# Patient Record
Sex: Female | Born: 1935 | Race: White | Hispanic: No | Marital: Married | Smoking: Never smoker
Health system: Southern US, Community
[De-identification: ages and names within clinical notes are randomized; demographics above are authoritative.]

## PROBLEM LIST (undated history)

## (undated) DIAGNOSIS — C50912 Malignant neoplasm of unspecified site of left female breast: Secondary | ICD-10-CM

## (undated) DIAGNOSIS — M8580 Other specified disorders of bone density and structure, unspecified site: Secondary | ICD-10-CM

## (undated) DIAGNOSIS — E785 Hyperlipidemia, unspecified: Secondary | ICD-10-CM

## (undated) DIAGNOSIS — IMO0002 Reserved for concepts with insufficient information to code with codable children: Secondary | ICD-10-CM

## (undated) DIAGNOSIS — H409 Unspecified glaucoma: Secondary | ICD-10-CM

## (undated) DIAGNOSIS — Z9889 Other specified postprocedural states: Secondary | ICD-10-CM

## (undated) DIAGNOSIS — C189 Malignant neoplasm of colon, unspecified: Secondary | ICD-10-CM

## (undated) DIAGNOSIS — K635 Polyp of colon: Secondary | ICD-10-CM

## (undated) DIAGNOSIS — I10 Essential (primary) hypertension: Secondary | ICD-10-CM

## (undated) DIAGNOSIS — C50911 Malignant neoplasm of unspecified site of right female breast: Secondary | ICD-10-CM

## (undated) DIAGNOSIS — M1990 Unspecified osteoarthritis, unspecified site: Secondary | ICD-10-CM

## (undated) DIAGNOSIS — R112 Nausea with vomiting, unspecified: Secondary | ICD-10-CM

## (undated) HISTORY — PX: TUBAL LIGATION: SHX77

## (undated) HISTORY — DX: Polyp of colon: K63.5

## (undated) HISTORY — PX: CATARACT EXTRACTION W/ INTRAOCULAR LENS  IMPLANT, BILATERAL: SHX1307

## (undated) HISTORY — DX: Other specified disorders of bone density and structure, unspecified site: M85.80

## (undated) HISTORY — DX: Unspecified osteoarthritis, unspecified site: M19.90

## (undated) HISTORY — DX: Hyperlipidemia, unspecified: E78.5

## (undated) HISTORY — DX: Unspecified glaucoma: H40.9

## (undated) HISTORY — DX: Reserved for concepts with insufficient information to code with codable children: IMO0002

## (undated) HISTORY — DX: Essential (primary) hypertension: I10

---

## 1984-04-16 HISTORY — PX: CHOLECYSTECTOMY OPEN: SUR202

## 1990-09-16 DIAGNOSIS — C50911 Malignant neoplasm of unspecified site of right female breast: Secondary | ICD-10-CM

## 1990-09-16 HISTORY — DX: Malignant neoplasm of unspecified site of right female breast: C50.911

## 1990-09-16 HISTORY — PX: MASTECTOMY: SHX3

## 1990-09-16 HISTORY — PX: BREAST BIOPSY: SHX20

## 1999-09-04 ENCOUNTER — Other Ambulatory Visit: Admission: RE | Admit: 1999-09-04 | Discharge: 1999-09-04 | Payer: Self-pay | Admitting: Obstetrics and Gynecology

## 1999-09-17 DIAGNOSIS — C189 Malignant neoplasm of colon, unspecified: Secondary | ICD-10-CM

## 1999-09-17 HISTORY — DX: Malignant neoplasm of colon, unspecified: C18.9

## 2000-01-24 ENCOUNTER — Encounter (INDEPENDENT_AMBULATORY_CARE_PROVIDER_SITE_OTHER): Payer: Self-pay | Admitting: *Deleted

## 2000-01-24 ENCOUNTER — Ambulatory Visit (HOSPITAL_COMMUNITY): Admission: RE | Admit: 2000-01-24 | Discharge: 2000-01-24 | Payer: Self-pay | Admitting: Surgery

## 2000-02-12 ENCOUNTER — Encounter: Payer: Self-pay | Admitting: Surgery

## 2000-02-20 ENCOUNTER — Encounter (INDEPENDENT_AMBULATORY_CARE_PROVIDER_SITE_OTHER): Payer: Self-pay | Admitting: Specialist

## 2000-02-20 ENCOUNTER — Inpatient Hospital Stay (HOSPITAL_COMMUNITY): Admission: RE | Admit: 2000-02-20 | Discharge: 2000-02-23 | Payer: Self-pay | Admitting: Surgery

## 2000-02-20 HISTORY — PX: COLECTOMY: SHX59

## 2000-04-18 ENCOUNTER — Encounter: Payer: Self-pay | Admitting: Surgery

## 2000-04-18 ENCOUNTER — Emergency Department (HOSPITAL_COMMUNITY): Admission: EM | Admit: 2000-04-18 | Discharge: 2000-04-18 | Payer: Self-pay | Admitting: Emergency Medicine

## 2000-09-10 ENCOUNTER — Other Ambulatory Visit: Admission: RE | Admit: 2000-09-10 | Discharge: 2000-09-10 | Payer: Self-pay | Admitting: Family Medicine

## 2001-03-06 ENCOUNTER — Ambulatory Visit (HOSPITAL_COMMUNITY): Admission: RE | Admit: 2001-03-06 | Discharge: 2001-03-06 | Payer: Self-pay | Admitting: Surgery

## 2001-07-03 ENCOUNTER — Encounter: Payer: Self-pay | Admitting: Ophthalmology

## 2001-07-07 ENCOUNTER — Ambulatory Visit (HOSPITAL_COMMUNITY): Admission: RE | Admit: 2001-07-07 | Discharge: 2001-07-07 | Payer: Self-pay | Admitting: Ophthalmology

## 2001-09-11 ENCOUNTER — Other Ambulatory Visit: Admission: RE | Admit: 2001-09-11 | Discharge: 2001-09-11 | Payer: Self-pay | Admitting: *Deleted

## 2001-12-30 ENCOUNTER — Ambulatory Visit: Admission: RE | Admit: 2001-12-30 | Discharge: 2001-12-30 | Payer: Self-pay | Admitting: Gynecology

## 2002-03-15 ENCOUNTER — Encounter: Payer: Self-pay | Admitting: Family Medicine

## 2002-03-15 ENCOUNTER — Encounter: Admission: RE | Admit: 2002-03-15 | Discharge: 2002-03-15 | Payer: Self-pay | Admitting: Family Medicine

## 2002-04-13 ENCOUNTER — Ambulatory Visit (HOSPITAL_COMMUNITY): Admission: RE | Admit: 2002-04-13 | Discharge: 2002-04-13 | Payer: Self-pay | Admitting: Ophthalmology

## 2002-09-20 ENCOUNTER — Other Ambulatory Visit: Admission: RE | Admit: 2002-09-20 | Discharge: 2002-09-20 | Payer: Self-pay | Admitting: *Deleted

## 2003-08-09 ENCOUNTER — Ambulatory Visit (HOSPITAL_COMMUNITY): Admission: RE | Admit: 2003-08-09 | Discharge: 2003-08-09 | Payer: Self-pay | Admitting: Obstetrics and Gynecology

## 2003-08-10 ENCOUNTER — Ambulatory Visit (HOSPITAL_COMMUNITY): Admission: RE | Admit: 2003-08-10 | Discharge: 2003-08-10 | Payer: Self-pay | Admitting: Surgery

## 2003-12-02 ENCOUNTER — Other Ambulatory Visit: Admission: RE | Admit: 2003-12-02 | Discharge: 2003-12-02 | Payer: Self-pay | Admitting: *Deleted

## 2005-12-30 ENCOUNTER — Other Ambulatory Visit: Admission: RE | Admit: 2005-12-30 | Discharge: 2005-12-30 | Payer: Self-pay | Admitting: Obstetrics and Gynecology

## 2007-01-16 ENCOUNTER — Other Ambulatory Visit: Admission: RE | Admit: 2007-01-16 | Discharge: 2007-01-16 | Payer: Self-pay | Admitting: Obstetrics & Gynecology

## 2007-03-18 ENCOUNTER — Encounter: Admission: RE | Admit: 2007-03-18 | Discharge: 2007-03-18 | Payer: Self-pay | Admitting: Surgery

## 2008-01-21 ENCOUNTER — Other Ambulatory Visit: Admission: RE | Admit: 2008-01-21 | Discharge: 2008-01-21 | Payer: Self-pay | Admitting: Obstetrics & Gynecology

## 2008-08-09 ENCOUNTER — Encounter: Admission: RE | Admit: 2008-08-09 | Discharge: 2008-08-09 | Payer: Self-pay | Admitting: Radiology

## 2010-08-02 ENCOUNTER — Encounter: Admission: RE | Admit: 2010-08-02 | Discharge: 2010-08-02 | Payer: Self-pay | Admitting: Family Medicine

## 2010-11-28 ENCOUNTER — Ambulatory Visit
Admission: RE | Admit: 2010-11-28 | Discharge: 2010-11-28 | Disposition: A | Discharge status: Home / Self Care | Payer: Medicare Other | Source: Ambulatory Visit | Attending: Family Medicine | Admitting: Family Medicine

## 2010-11-28 ENCOUNTER — Other Ambulatory Visit: Payer: Self-pay | Admitting: Family Medicine

## 2010-11-28 DIAGNOSIS — M79671 Pain in right foot: Secondary | ICD-10-CM

## 2010-11-28 DIAGNOSIS — M25571 Pain in right ankle and joints of right foot: Secondary | ICD-10-CM

## 2011-02-01 NOTE — H&P (Signed)
NAME:  Morgan Mullins, Morgan Mullins                          ACCOUNT NO.:  [id number]   MEDICAL RECORD NO.:  [id number]                   PATIENT TYPE:  OIB   LOCATION:  2861                                 FACILITY:  [facility]   PHYSICIAN:  Guadelupe Sabin, M.D.             DATE OF BIRTH:  27-Jun-1936   DATE OF ADMISSION:  04/13/2002  DATE OF DISCHARGE:                                HISTORY & PHYSICAL   HISTORY OF PRESENT ILLNESS:  This was a planned outpatient readmission of  this 75 year old white female, admitted for cataract implant surgery of the  right eye.  This patient has been noted to have bilateral cataract  formation.  She was previously admitted on 07/07/2001 for successful  cataract implant surgery of the left eye.  She now returns for similar  surgery of the right eye.   PAST MEDICAL HISTORY:  Stable general health.  No serious illnesses.  Patient under the care of Dr. Gaylan Gerold.   REVIEW OF SYSTEMS:  No cardiorespiratory complaint.   PHYSICAL EXAMINATION:  VITAL SIGNS:  Blood pressure 131/82, respirations 16,  heart rate 53, temperature 97.  GENERAL APPEARANCE:  The patient is a pleasant well developed, well  nourished 75 year old white female, in no acute distress.  HEENT:  Eyes -- Visual acuity with best correction 20/40 right eye, 20/25  left eye.  Applanation tonometry normal; 18 mm right eye, 19 mm left eye.  Slit lamp examination -- The eyes are white and clear.  The right eye  reveals an anterior subcapsular cataract and nuclear cataract.  The left eye  shows a clear well-centered posterior chamber intraocular lens implant, with  a clear capsule.  Detailed dilated fundus examination with indirect  ophthalmoscopy reveals a clear vitreous; behind the cataract there is haze  in the right eye.  The vitreous is clear in both eyes.  The retina attached  with normal optic nerve, blood vessels and macula.   IMPRESSION:  Nuclear and anterior subcapsular cataract, right eye.  Pseudophakia left eye.   SURGICAL PLAN:  Cataract implant surgery, right eye.                                               Guadelupe Sabin, M.D.    HNJ/MEDQ  D:  04/13/2002  T:  04/14/2002  Job:  [id number]   cc:   [place]

## 2011-02-01 NOTE — Discharge Summary (Signed)
[place]. [facility]  Patient:    Morgan Mullins, Morgan Mullins                       MRN: [id number] Adm. Date:  [id number] Disc. Date: [id number] Attending:  Andre Lefort CC:         Dyanne Carrel, M.D.                           Discharge Summary  DATE OF BIRTH:  [birth year].  DISCHARGE DIAGNOSES: 1. 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 2. Status post right breast cancer in 1992. 3. Status post parotid surgery in 1995. 4. Tubal ligation. 5. History of mild reflux esophagitis.  OPERATIONS PERFORMED:  The patient had a right hemicolectomy, laparoscopically assisted on February 20, 2000.  HISTORY OF PRESENT ILLNESS:  Morgan Mullins is a 75 year old white female who is a patient of Dyanne Carrel, M.D.  She has a colonoscopy based on both her history of breast cancer and a family history of colon cancer but had no other suspicious findings.  The colonoscopy done by me reveals a broad-based villous adenoma that was not amenable to resection by colonoscope and felt she would be best served with a laparoscopic attempted right hemicolectomy.  She is disease-free from breast cancer diagnosed in 78. She also had parotid surgery in 1995 which she has been well with.  On the day of admission after completing a mechanical and antibiotic bowel prep, she was taken to the operating room where she underwent a right hemicolectomy which was laparoscopically assisted and a stapled side-to-side anastomosis.  Postoperatively she did extremely well.  She was walking the hall the first day after surgery.  She had some bowel gas the second day after surgery and was started on clear liquids and then advanced to a soft diet.  Her final pathology was [id number], read by Dr. Jimmy Picket, revealed a right colon mildly differentiated colonic adenocarcinoma invading into the  submucosa arising a large sessile tubovillous adenoma.  She had a T1, N0 carcinoma.  DISCHARGE INSTRUCTIONS:  She will be on a regular diet.  No lifting for a period of about four weeks.  She is to see me back in seven days. She can take Vicodin for pain or Tylenol.  She can shower. She is to call for any problems.  DISCHARGE CONDITION:  Good. DD:  04/16/00 TD:  04/17/00 Job: [id number]

## 2011-02-01 NOTE — Op Note (Signed)
[place]. [facility]  Patient:    Morgan Mullins, Morgan Mullins                       MRN: [id number] Proc. Date: 03/06/01 Adm. Date:  [id number] Attending:  Andre Lefort CC:         Dyanne Carrel, M.D.   Operative Report  PREOPERATIVE DIAGNOSIS:  Right colon cancer resected June 2001.  POSTOPERATIVE DIAGNOSIS:  Normal anastomosis with sigmoid diverticulosis.  PROCEDURE:  Flexible colonoscopy.  SURGEON:  Sandria Bales. Ezzard Standing, M.D.  ANESTHESIA:  7 mg of Versed, 75 mcg of fentanyl.  INDICATION FOR PROCEDURE:  [name] is a 75 year old white female who had a right hemicolectomy for a right colon cancer resected in June 2001.  She has done well from her colon cancer with no colonic complaint, a normal CEA.  She now comes back for her one-year follow-up colonoscopy.  DESCRIPTION OF PROCEDURE:  She was unable to complete her GoLYTELY bowel prep but drank enough that she thought she had clear stools, and I thought it was worth going ahead.  She was placed on telemetry, blood pressure cuff, pulse oximetry, had nasal O2 and an IV in her left hand.  She was given fentanyl because she had some nausea which she thought was associated with Demerol.  A flexible colonoscope was passed.  She had a tortuous sigmoid colon, which made this somewhat difficult advancing.  I was able to advance the scope, however, around to approximately 140 cm, identifying a transverse colon to ileal anastomosis at about 140 cm.  The transverse colon, left colon were unremarkable.  The sigmoid colon, again, was somewhat tortuous, thickened, with diverticular disease, which made it difficult advancing the scope through this area.  I suggest next time probably using a pediatric colonoscope on her and, in fact, I stripped one of the deflectors on the colonoscope after advancing the scope entirely, which did limit to some extent visualizing the bowel coming out, but again I saw no polyp,  lesion, or mass.  She tolerated the procedure well.  Would recommend repeat colonoscopy in three years.  Spoke with her husband.  Will see her back for regular follow-up in six months. DD:  03/06/01 TD:  03/06/01 Job: [id number]

## 2011-02-01 NOTE — Op Note (Signed)
[place]. [facility]  Patient:    Morgan Mullins, Morgan Mullins                       MRN: [id number] Proc. Date: 02/20/00 Adm. Date:  [id number] Attending:  Andre Lefort CC:         Dyanne Carrel, M.D.             Lorne Skeens. Hoxworth, M.D.                           Operative Report  DATE OF BIRTH:  09-22-1935.  PREOPERATIVE DIAGNOSIS:  Tubulovillous adenoma of the right colon.  POSTOPERATIVE DIAGNOSIS:  Tubulovillous adenoma of the right colon, approximately 3 cm in diameter.  PROCEDURE:  Laparoscopically-assisted right hemicolectomy with stapled side-to-side anastomosis.  SURGEON:  Sandria Bales. Ezzard Standing, M.D.  FIRST ASSISTANT:  Sharlet Salina T. Hoxworth, M.D.  ANESTHESIA:  General endotracheal.  ESTIMATED BLOOD LOSS:  100 cc.  DRAINS LEFT IN:  None.  INDICATION FOR PROCEDURE:  Ms. Lahm is a 75 year old white female who had a prior right breast cancer in 1992.  She has had a history of colonic polyps in the past and had a follow-up colonoscopy approximately one month ago.  The colonoscopy revealed a broad-based polyp of the right colon, which on biopsy proved to be tubulovillous in nature.  She now comes for attempted laparoscopic-assisted right hemicolectomy.  The indications and complications of the procedure were explained to the patient.  OPERATIVE NOTE:  The patient was placed in the supine position.  She was given a general endotracheal anesthetic.  She had an NG tube in place, was given 1 g of Cefotetan at initiation of the procedure and had a Foley catheter in place. PAS stockings were then placed, and her arms were tucked by her side.  Her abdomen was then prepped with Betadine solution and sterilely draped.  An infraumbilical incision was made with a 0 degree laparoscope inserted through a 12 mm Hasson trocar, and this was inserted and secured with a 0 Vicryl suture.   A laparoscopic examination was then carried out.  The patient  was noted to have adhesions in the right upper quadrant from her prior cholecystectomy incision.  She had adhesions of stomach to the anterior abdominal wall, and the hepatic flexure was stuck up in the gallbladder fossa, but her right and left colons were unremarkable near the lesion.  The remainder of her bowel was unremarkable for any other lesion.  She had no obvious pelvic mass or lesion.  Through this, the trocars were placed, a 10 mm trocar in the suprapubic location, a 10 mm trocar in the left lower quadrant location, and a 5 mm trocar in the subcostal location.  The camera was then moved to the left lower quadrant location, giving [place] three arms to work with.  Interestingly, the cecum actually was more right upper quadrant based and fairly floppy.  Using the Harmonic scalpel, I was able to free up the stomach from the anterior abdominal wall.  I was able to free the colon down from the gallbladder bed under direct visualization, which was seen to be pretty minimal blood loss.  I then went along the line of Toldt lateral to the right colon.  The colon mobilized well enough.  I then opened the lesser sac and pulled down the transverse colon.  At this point, I thought I had good  mobility of the colon.  I did not find a ureter, though I thought I was dissecting well anterior to where the ureter was.  I actually did get anterior to the right kidney, which I identified.  I identified the duodenum, which was well posterior to our dissection.  At this time, I took out the umbilical trocar, made an open incision in the abdomen, which was about 6 or 7 cm in size, and delivered the right colon, terminal ileum, and transverse colon into the abdominal wound.  I thought I had good length of mesentery up to take an apron of the tissue.  Actually, she had even one palpable lymph node that I could feel up in the specimens I got below.  The specimen did have some neovascularization in it.  There  was a mass about 3 cm, which I could feel, and I thought we identified the right segment of bowel.  At this time we took the mesentery down using 2-0 and 3-0 silk ties and suture ligatures.  After freeing up the distal 5-6 cm of the terminal ileum and the transverse colon, I then carried out a stapled side-to-side anastomosis using the Endo-GIA stapler 70 mm length. I then inspected the suture lines, in which there was no evidence of bleeding and appeared to be intact, and the sutures fired well, and then I fired across with a TA55 stapler, divided the bowel, and delivered the specimen off the table.  I inspected the bowel.  I closed the mesentery with interrupted 2-0 silk sutures.  I laid some omentum over the staple line to cover this and then dropped the bowel back into the abdominal cavity.  I then opened the specimen on the table side and identified the specimen and thought I had good margins both proximally and distally.  I then closed the abdomen with two running #1 PDS sutures, which I tied in the middle.  I then re-insufflated the abdomen.  There was no leak of air or no weakness in the midline and closure.  There was no evidence of any bleeding intra-abdominally or where the gallbladder bed had been taken down.  I removed the other trocars under direct visualization and closed each wound with skin staples.  The patient tolerated the procedure well.  The wound was then sterilely dressed.  I removed her NG tube at the end of the case, will leave the Foley in overnight.  The sponge and needle count were correct at the end of the case. DD:  02/20/00 TD:  02/23/00 Job: [id number]

## 2011-02-01 NOTE — Op Note (Signed)
[place]. [facility]  Patient:    Morgan Mullins, Morgan Mullins Visit Number: [id number] MRN: [id number]          Service Type: DSU Location: [facility] 2899 23 Attending Physician:  Ivor Messier Dictated by:   Guadelupe Sabin, M.D. Proc. Date: 07/07/01 Admit Date:  07/07/2001 Discharge Date: 07/07/2001   CC:         Dyanne Carrel, M.D.   Operative Report  PREOPERATIVE DIAGNOSIS: Nuclear and anterior subcapsular cataract, left eye.  POSTOPERATIVE DIAGNOSIS: Nuclear and anterior subcapsular cataract, left eye.  OPERATION/PROCEDURE: Planned extracapsular cataract extraction, phacoemulsification, primary insertion of posterior chamber intraocular lens implant.  SURGEON: Guadelupe Sabin, M.D.  ASSISTANT: Nurse.  ANESTHESIA: Local 4% xylocaine, 0.75% Marcaine, anesthesia standby required. Patient given sodium pentothal intravenously during the period of retrobulbar injection.  DESCRIPTION OF PROCEDURE: AFter the patient was prepped and draped a lid speculum was inserted in the left eye.  Schiotz tenometry was recorded at 7 scale units with a 5.5 g weight.  A peritomy was performed adjacent to the limbus from the 11 oclock to one oclock position.  The corneoscleral junction was cleaned and a corneoscleral groove made with the 45 degree Superblade.  The anterior chamber was then entered with the 2.5 mm diamond keratome at the 12 oclock position and the 15 degree blade at the 2:30 oclock position.  Using a bent 26 gauge needle on a Healon syringe a circular capsulorrhexis was begun and then completed with the Grabow forceps. Hydrodissection and hydrodelineation were performed using 1% xylocaine.  The 30 degree phacoemulsification tip was then inserted, with slow emulsification of the rather soft nucleus.  The nucleus was aspirate and the residual cortex aspirated with the irrigation-aspiration tip.  There appeared to be a small film-like opacity  behind the posterior capsule.  This was felt to represent either a tiny break the posterior capsule with some migration of the Healon into the anterior vitreous.  It did not appear to be a cortex fragment or a nuclear fragment.  It was then elected to insert an Allergan Medical Optics SI40MB silicone three-piece posterior chamber intraocular lens implant in the capsular bag.  This was inserted with the McDonalds forceps into the anterior chamber and then centered into the capsular bag.  At no time was there any vitreous noted in the anterior chamber or any pupillary disfiguration indicating vitreous loss.  The lens appeared to be well centered.  A small fragment of cortex which remained at the 12 oclock position was partially aspirated.  Since there was no vitreous incarceration it was elected to close. The operative incisions appeared to be self-sealing and no sutures were required.  Maxitrol was instilled in the conjunctival cul-de-sac and a light patch and protective shield were applied to the operated eye.  Duration of procedure and anesthesia administration, 45 minutes.  The patient tolerated the procedure well in general. Dictated by:   Guadelupe Sabin, M.D. Attending Physician:  Ivor Messier DD:  07/07/01 TD:  07/08/01 Job: [id number]

## 2011-02-01 NOTE — Op Note (Signed)
. [facility]  Patient:    Morgan Mullins, Morgan Mullins                      MRN: [id number] Proc. Date: 01/24/00 Adm. Date:  [id number] Attending:  Andre Lefort CC:         Dyanne Carrel, M.D.                           Operative Report  DATE OF BIRTH:  March 14, 1936  CCS#:  [id number]  PREOPERATIVE DIAGNOSIS:  History of colonic polyps with strong family history of colon cancer.  POSTOPERATIVE DIAGNOSIS:  Cecal neoplasm, pathology pending.  Diverticulosis of the sigmoid colon.  PROCEDURE:  Colonoscopy with biopsy of cecal mass.  SURGEON:  Sandria Bales. Ezzard Standing, M.D.  ANESTHESIA:  Demerol 60 mg, 6 mg of Versed.  INDICATIONS:  [name] is a 75 year old white female, a patient of Dr. Blair Heys, who in the past, has had a colonoscopy by Dr. Enzo Bi. Blievernicht.  She had a history of polyps of the sigmoid and ascending colon removed before.  She also has a strong history of colon cancer.  She comes for colonoscopy evaluation.  She does give a history of having some change in her bowel habits and some difficulty initiating her bowel habits.  PROCEDURE NOTE:  The patient had a GoLYTELY bowel prep the evening before the colonoscopy.  She was in a left lateral decubitus position, an IV started in her left hand.  She had a pulse oximeter.  EKG tracings and blood pressure cuff monitoring.  A flexible colonoscope was passed.  She had a fairly tortuous sigmoid colon and this made it difficult advancing the colon.  However, I was able to advance the scope with some difficulty around to the cecum.  I identified the ileocecal valve.  Opposite the ileocecal valve, the patient had a 3-4 cm polyp or mass, which was sessile, feathery looking and looked like a villous adenoma.  I biopsied this x 6.  I do not think the polyp is amenable to colonoscopic resection.  The scope was then slowly withdrawn.  The lining of the mucosa was otherwise normal.  In  the remainder of the right colon, the transverse colon, left colon and the sigmoid colon there was probably moderate diverticulosis with some thickening of the bowel wall, but there was no obvious mucosal lesion.  The scope was retroflexed within the rectum.  There was no mass or lesion.  IMPRESSION: 1. [name] has a neoplasm of her right colon with biopsies pending that was    not amenable to colonic resection.  Will probably need to have a colon    resection. 2. Scattered sigmoid diverticulosis.  I discussed the findings with her husband and she will see me back in the office in about five days to review pathology and review findings. DD:  01/24/00 TD:  01/25/00 Job: [id number]

## 2011-02-01 NOTE — Op Note (Signed)
Morgan Mullins, Morgan Mullins                          ACCOUNT NO.:  [id number]   MEDICAL RECORD NO.:  [id number]                   PATIENT TYPE:  OIB   LOCATION:  2861                                 FACILITY:  [facility]   PHYSICIAN:  Guadelupe Sabin, M.D.             DATE OF BIRTH:  [birth year]   DATE OF PROCEDURE:  04/13/2002  DATE OF DISCHARGE:                                 OPERATIVE REPORT   PREOPERATIVE DIAGNOSES:  Anterior subcapsular and nuclear cataract, right  eye.   POSTOPERATIVE DIAGNOSES:  Anterior subcapsular and nuclear cataract, right  eye.   OPERATION:  1. Planned extracapsular cataract extraction/phacoemulsification.  2. Primary insertion of posterior chamber intraocular lens implant, right     eye.   SURGEON:  Guadelupe Sabin, M.D.   ASSISTANT:  Nurse.   ANESTHESIA:  Local, 4% Xylocaine and 0.75 Marcaine retrobulbar block;  topical tetracaine, intraocular Xylocaine.   OPERATIVE PROCEDURE:  After the patient was prepped and draped; a lid  speculum was inserted in the right eye.  Schiotz tonometry was recorded at 5  scale units, with a 5.5 g weight.  A peritomy was performed adjacent to the  limbus from the 11 to 1 o'clock position.  The corneoscleral junction was  cleaned and a corneoscleral groove made with a 45-degree superblade at the  12 o'clock position.  The anterior chamber was then entered with a 2.5 mm  diamond keratome at the 12 o'clock position, and the 15-degree blade at the  2:30 position.  Using a bent 26-gauge needle on a Healon syringe, a circular  capsulorhexis was begun and then completed with the Graybill forceps.  Hydrodissection and hydrodelineation were performed using 1% Xylocaine.  A  30-degree phacoemulsification tip was then inserted, with slow-controlled  emulsification of the lens nucleus.  Total ultrasonic time:  55 sec.  Average Power Level:  18%.  Total amount of fluid used during the  emulsification:  80 cc.   Following removal  of the nucleus, the residual cortex was aspirated with the  irrigation aspiration tip.  The posterior capsule appeared intact, with a  brilliant red fundus reflux.  It was therefore elected to insert an Allergan  Medical Optics SI40 and B-silicone three-piece posterior chamber intraocular  implant with UV absorber.  Diopter strength:  +20.50.  This was inserted  with a McDonald forceps into the anterior chamber, and then centered into  the capsular bag using the lesser lens rotator.  The lens appeared to be  well centered.  The Healon which had been used throughout the procedure was  then aspirated and replaced with balanced salt solution and Miochol  ophthalmic solution.  The operative incisions appeared to be self-sealing  and no sutures were required.  Maxitrol ointment was instilled in the  conjunctival cul-de-sac and a light patch and protective shield applied to  the operated right eye.   DURATION OF PROCEDURE AND ANESTHESIA  ADMINISTRATION:  45 minutes.    DISPOSITION:  The patient tolerated the procedure well in general.  Left the  operating room for the recovery room in good condition.                                               Guadelupe Sabin, M.D.    HNJ/MEDQ  D:  04/13/2002  T:  04/14/2002  Job:  [id number]

## 2011-02-01 NOTE — H&P (Signed)
[place]. [facility]  Patient:    BHAVANA, KADY Visit Number: [id number] MRN: [id number]          Service Type: DSU Location: [facility] 2899 23 Attending Physician:  Ivor Messier Dictated by:   Guadelupe Sabin, M.D. Admit Date:  07/07/2001 Discharge Date: 07/07/2001   CC:         Dyanne Carrel, M.D.   History and Physical  CHIEF COMPLAINT: This was a planned outpatient admission of this 75 year old white female, admitted for cataract implant surgery of the left eye.  HISTORY OF PRESENT ILLNESS: This patient has been followed in my office since May 02, 1969.  Initial examination at that time revealed a visual acuity without correction of 20/20.  The patient has done well over the ensuing years but has recently gradually developed cataract formation in both eyes, greater in the left than right eye.  Vision had deteriorated to 20/40- by March 05, 2001 and the patient was having trouble with blurred vision, and this was annoying her.  She elected to proceed with cataract implant surgery.  She was given oral discussion and printed information concerning the procedure and its possible complications.  She signed an informed consent and arrangements were made for her outpatient admission at this time.  PAST MEDICAL HISTORY: The patient is in stable general health, under the care of Dr. Manus Gunning.  The patient has a past history of colon carcinoma with resection in June 2001.  Colonoscopy was performed on March 06, 2001.  REVIEW OF SYSTEMS: No cardiorespiratory complaints.  PHYSICAL EXAMINATION:  GENERAL: The patient is a pleasant, well-nourished, well-developed white female in no acute distress.  HEENT: Eyes, visual acuity as noted above.  Applanation tenometry 15 mm right eye, 18 mm left eye.  External ocular and slit-lamp examination, the eyes are white and clear with cataract formation in both eyes, nuclear type, and anterior  subcapsular cataract change in the left eye.  Fundus examination normal.  CHEST: Lungs clear to percussion and auscultation.  HEART: Normal sinus rhythm.  No cardiomegaly.  No murmurs.  ABDOMEN: Negative.  EXTREMITIES: Negative.  ADMISSION DIAGNOSIS: Nuclear and anterior subcapsular cataract, left eye.  SURGICAL PLAN: Cataract implant surgery, left eye. Dictated by:   Guadelupe Sabin, M.D. Attending Physician:  Ivor Messier DD:  07/07/01 TD:  07/08/01 Job: [id number]

## 2011-02-01 NOTE — Consult Note (Signed)
[facility]  Patient:    Morgan Mullins, Morgan Mullins Visit Number: [id number] MRN: [id number]          Service Type: GON Location: GYN Attending Physician:  Jeannette Corpus Dictated by:   Rande Brunt. Clarke-Pearson, M.D. Admit Date:  12/30/2001   CC:         Cheryle Horsfall, M.D.  Telford Nab, R.N.  Sandria Bales. Ezzard Standing, M.D.   Consultation Report  HISTORY OF PRESENT ILLNESS:  This is a 75 year old white female who is referred in consultation from Dr. Cheryle Horsfall for risk assessment of ovarian cancer. The patient has a personal history of breast cancer in 1992 and a recent history of early-stage colon cancer in 2001.  FAMILY HISTORY:  Remarkable in that her mother died at age 1 of advanced ovarian cancer, and her father died at age 15 of colon cancer. There are no other family members with ovarian cancer. A paternal aunt has developed breast cancer in her 82s. There are no other family members with breast cancer or colon cancer.  PAST MEDICAL HISTORY:  Breast cancer, colon cancer, hypercholesterolemia.  CURRENT MEDICATIONS: 1. Zocor. 2. Multivitamins. 3. Vitamin E. 4. Baby aspirin.  SOCIAL HISTORY:  The patient does not smoke or drink. The patient is married. She is a retired Catering manager.  ALLERGIES:  No known drug allergies.  GYNECOLOGICAL HISTORY:  Gravida 0.  REVIEW OF SYSTEMS:  Essentially negative.  PHYSICAL EXAMINATION:  VITAL SIGNS:  Weight 150 pounds, blood pressure 148/88, pulse 76, respiratory rate 18.  GENERAL:  The patient is a healthy, white female in no acute distress.  HEENT:  Negative.  NECK:  Supple without thyromegaly. There is no supraclavicular or inguinal adenopathy.  ABDOMEN:  Soft, nontender.  No masses, organomegaly, ascites or hernias noted. All of her scars are well healed.  PELVIC:  EG/BUS normal. Vagina is normal. No lesions are noted. The cervix is normal. The uterus is anterior, normal shape, size,  and consistency. There are no adnexal masses. Anus is normal. Stool is guaiac negative.  IMPRESSION:  This is a 75 year old white female with a personal history of breast cancer and colon cancer and a family history of a mother with ovarian cancer and a father with colon cancer and a paternal aunt with breast cancer. It is very difficult to assess the patients risk of having ovarian cancer. There is no doubt that it is increased given that she has 1 1st degree relative with ovarian cancer, and I believe her past history of breast cancer and colon cancer also increase her risk. On the other hand, reviewing the family history, this does not appear to be family that has the classic BRCA 1 or 2 mutations, although they may have a Lynch II pedigree on the paternal side. The patient and I had a lengthy discussion indicating that it was very difficult to assess her risk above and beyond saying that it is slightly increased. Certainly if she wished, bilateral salpingo-oophorectomy would be reasonable. On the other hand, if she does not wish to undergo surgery, surveillance using annual vaginal probe ultrasounds to evaluate the ovaries would also be reasonable. All questions were answered. At the conclusion of the discussion, she indicated she would prefer not to have surgery and would opt to undergo vaginal probe ultrasound. We did discuss CA125 value monitoring, and I would not recommend that given the high false-negative and false-positive rates. Dictated by:   Rande Brunt. Clarke-Pearson, M.D. Attending Physician:  Jeannette Corpus DD:  12/30/01 TD:  12/31/01 Job: [id number]

## 2011-02-01 NOTE — Op Note (Signed)
NAME:  Morgan Mullins, Morgan Mullins                          ACCOUNT NO.:  [id number]   MEDICAL RECORD NO.:  [id number]                   PATIENT TYPE:  AMB   LOCATION:  ENDO                                 FACILITY:  [facility]   PHYSICIAN:  Sandria Bales. Ezzard Standing, M.D.               DATE OF BIRTH:  10-May-1936   DATE OF PROCEDURE:  08/09/2003  DATE OF DISCHARGE:                                 OPERATIVE REPORT   PREOPERATIVE DIAGNOSIS:  History of right colon carcinoma.   POSTOPERATIVE DIAGNOSIS:  Stricture/narrowing of sigmoid colon at 30 cm,  without mucosa lesion.   PROCEDURE:  Sigmoidoscopy with scope advanced only to 70 cm.   SURGEON:  Sandria Bales. Ezzard Standing, M.D.   ANESTHESIA:  Demerol 75 mg, Versed 5 mg.   INDICATIONS FOR PROCEDURE:  The patient is a 75 year old white female who  had a right hemicolectomy in June 2001, for a T1, N0 carcinoma.  She has  done well postoperatively without evidence of recurrence; however, she has  complained of some left lower quadrant abdominal pain and now comes up for a  follow-up colonoscopy.  She completed a bowel prep at home.   DESCRIPTION OF PROCEDURE:  The patient was placed in the left lateral  decubitus position.  She had a pulse oximeter, an electrocardiogram, a blood  pressure cuff on, and was given nasal O2 and monitored during the procedure.  She was given initially 5 mg of Versed and 50 mg of Demerol.  She was given  an additional 25 mg of Demerol during the procedure.  I was able to advance  the scope easily to 30 cm, but at 30 cm I really had trouble forcing the  scope forward.  I did not see a mucosal lesion, but I felt like there was a  tight grip on the scope.  Whether this is actually scar tissue or this is a  narrowing from diverticular disease; I did see evidence of diverticulosis of  the sigmoid colon, is unclear.  I was able to push the scope up to 70 cm,  but I was using such force to advance the scope, I was really afraid of  doing some kind of  damage to the bowel.  Therefore I terminated the procedure at 70 cm, so this really counts only as  a sigmoidoscopy.  I did not get to the splenic flexure.  I will go ahead and  try to do a barium enema on her today, to evaluate the remainder of her  colon and this area of narrowing.   RECOMMENDATIONS:  I did speak to her husband about the findings.  I think  depending upon her symptoms, if this all proves to be benign, would probably  continue to follow her.  If these symptoms get worse, she actually may need  some type of resection to this portion of her colon; though again these  findings all appear  benign.  Again, pending upon her barium enema, would  recommend either an e x-barium enema or a colonoscopy, and she actually may  even be a candidate for a virtual colonoscopy in the future, because of this  area of narrowing.                                               Sandria Bales. Ezzard Standing, M.D.    DHN/MEDQ  D:  08/09/2003  T:  08/09/2003  Job:  [id number]

## 2011-02-14 ENCOUNTER — Encounter (INDEPENDENT_AMBULATORY_CARE_PROVIDER_SITE_OTHER): Payer: Self-pay | Admitting: Surgery

## 2011-04-17 ENCOUNTER — Ambulatory Visit (INDEPENDENT_AMBULATORY_CARE_PROVIDER_SITE_OTHER): Payer: Self-pay | Admitting: Surgery

## 2011-05-16 ENCOUNTER — Encounter (INDEPENDENT_AMBULATORY_CARE_PROVIDER_SITE_OTHER): Payer: Self-pay | Admitting: Surgery

## 2011-05-17 ENCOUNTER — Encounter (INDEPENDENT_AMBULATORY_CARE_PROVIDER_SITE_OTHER): Payer: Self-pay | Admitting: Surgery

## 2011-05-17 ENCOUNTER — Ambulatory Visit (INDEPENDENT_AMBULATORY_CARE_PROVIDER_SITE_OTHER): Payer: Medicare Other | Admitting: Surgery

## 2011-05-17 VITALS — BP 168/96 | HR 60 | Wt 134.0 lb

## 2011-05-17 DIAGNOSIS — Z85038 Personal history of other malignant neoplasm of large intestine: Secondary | ICD-10-CM | POA: Insufficient documentation

## 2011-05-17 DIAGNOSIS — Z853 Personal history of malignant neoplasm of breast: Secondary | ICD-10-CM | POA: Insufficient documentation

## 2011-05-17 NOTE — Progress Notes (Addendum)
ASSESSMENT AND PLAN: 1.  Stage 1 carcinoma of right breast  Treated with mastectomy in 1992.  Disease free.  2.  Stage 1 carcinoma of right colon  Treated with right colectomy 2001.  Disease free.  Plan one year follow-up.  Plan BE in one year (this will be 5 years from virtual colonoscopy.  She does not another virtual colonoscopy.)  3.  Diverticulitis.  Sigmoid colon.  Attack last November, 2011.  Has done well since then.   [facility] 1/2 negative - March 2013.  DN  11/28/2011] [Patient is CDH1 VUS - associated with breast cancer, colon cancer, and gastric cancer.  Dr. Para Skeans in [facility].  I talked to the patient on the phone.  They have suggested that she have the colonoscopies at [facility], which is fine by me.  DN 05/13/2012]  Morgan Mullins is a 75 y.o. (DOB: [birth year])  white female who is a patient of EHINGER,ROBERT R, MD and comes to me today for followup of colon and breast cancer.  The patient is doing well today. However, last November 2011, she had an attack of diverticulitis. She underwent a CT scan on 02 August 2010 this showed changes consistent with rectosigmoid colon diverticulitis. She responded to antibiotics and has had only one mild attack since then. She has noticed no change in her bowel habits and has had no blood in her stool.  She has had no trouble with her left breast.  She had a normal mammogram 07/31/2010.  We talked a little about Dr. Primitivo Gauze, who did her right mastectomy.  Current Outpatient Prescriptions on File Prior to Visit  Medication Sig Dispense Refill  . aspirin 81 MG tablet Take 81 mg by mouth daily.        Marland Kitchen CALCIUM-VITAMIN D PO Take by mouth daily.        . Cholecalciferol (VITAMIN D PO) Take 1,000 Units by mouth daily.        Marland Kitchen lisinopril (PRINIVIL,ZESTRIL) 10 MG tablet Take 10 mg by mouth daily.        . Multiple Vitamins-Minerals (CENTRUM SILVER PO) Take 1 tablet by mouth daily.        . simvastatin (ZOCOR) 40 MG tablet Take 40 mg  by mouth at bedtime.        . Travoprost (TRAVATAN OP) Apply to eye.         PHYSICAL EXAM: BP 168/96  Pulse 60  Wt 134 lb (60.782 kg)  HEENT: Normal. Pupils equal. Normal dentition. Neck: Supple. No thyroid mass. Lymph Nodes:  No supraclavicular or cervical nodes. Lungs: Clear and symmetric. Heart:  RRR. No murmur. Breasts:  Right breast absent.  No mass.  Left breast unremarkable.  She does have a 1.8 cm seborrheic keratosis at 12 o'clock on the skin.  Abdomen: No mass. No tenderness. No hernia. Normal bowel sounds.  No abdominal scars. Rectal: No mass.  Guiac negative. Extremities:  Good strength in upper and lower extremities. Neurologic:  Grossly intact to motor and sensory function.  DATA REVIEWED: CT scan - 08/02/2010 - showed sigmoid diverticulitis and two small low attenuation liver lesions. Mammogram - Nov. 2011 - negative.

## 2011-09-06 ENCOUNTER — Ambulatory Visit: Payer: Medicare Other

## 2011-09-06 NOTE — Progress Notes (Signed)
Pt. Seen for genetic counseling.  She will get more family history to determine which genetic syndrome to consider for blood draw.

## 2011-10-09 DIAGNOSIS — H4011X0 Primary open-angle glaucoma, stage unspecified: Secondary | ICD-10-CM | POA: Diagnosis not present

## 2011-10-22 DIAGNOSIS — Z79899 Other long term (current) drug therapy: Secondary | ICD-10-CM | POA: Diagnosis not present

## 2011-10-22 DIAGNOSIS — D179 Benign lipomatous neoplasm, unspecified: Secondary | ICD-10-CM | POA: Diagnosis not present

## 2011-10-22 DIAGNOSIS — E7800 Pure hypercholesterolemia, unspecified: Secondary | ICD-10-CM | POA: Diagnosis not present

## 2011-10-22 DIAGNOSIS — C189 Malignant neoplasm of colon, unspecified: Secondary | ICD-10-CM | POA: Diagnosis not present

## 2011-10-22 DIAGNOSIS — I10 Essential (primary) hypertension: Secondary | ICD-10-CM | POA: Diagnosis not present

## 2011-10-28 ENCOUNTER — Ambulatory Visit: Payer: Medicare Other | Admitting: Lab

## 2011-10-28 ENCOUNTER — Ambulatory Visit: Payer: Medicare Other

## 2011-10-28 DIAGNOSIS — Z803 Family history of malignant neoplasm of breast: Secondary | ICD-10-CM | POA: Diagnosis not present

## 2011-10-28 DIAGNOSIS — Z85038 Personal history of other malignant neoplasm of large intestine: Secondary | ICD-10-CM | POA: Diagnosis not present

## 2011-10-28 DIAGNOSIS — Z8041 Family history of malignant neoplasm of ovary: Secondary | ICD-10-CM | POA: Diagnosis not present

## 2011-10-28 DIAGNOSIS — Z853 Personal history of malignant neoplasm of breast: Secondary | ICD-10-CM | POA: Diagnosis not present

## 2011-10-28 NOTE — Progress Notes (Signed)
Pt seen previously for genetic counseling.  Blood drawn today for BRCA 1/2 and CancerNext.

## 2011-11-17 DIAGNOSIS — H401112 Primary open-angle glaucoma, right eye, moderate stage: Secondary | ICD-10-CM | POA: Insufficient documentation

## 2011-11-17 DIAGNOSIS — H4050X0 Glaucoma secondary to other eye disorders, unspecified eye, stage unspecified: Secondary | ICD-10-CM | POA: Insufficient documentation

## 2011-11-18 DIAGNOSIS — H4050X0 Glaucoma secondary to other eye disorders, unspecified eye, stage unspecified: Secondary | ICD-10-CM | POA: Diagnosis not present

## 2011-12-23 DIAGNOSIS — R05 Cough: Secondary | ICD-10-CM | POA: Diagnosis not present

## 2011-12-23 DIAGNOSIS — I10 Essential (primary) hypertension: Secondary | ICD-10-CM | POA: Diagnosis not present

## 2012-01-30 ENCOUNTER — Other Ambulatory Visit: Payer: Self-pay | Admitting: Dermatology

## 2012-01-30 DIAGNOSIS — L570 Actinic keratosis: Secondary | ICD-10-CM | POA: Diagnosis not present

## 2012-01-30 DIAGNOSIS — C44519 Basal cell carcinoma of skin of other part of trunk: Secondary | ICD-10-CM | POA: Diagnosis not present

## 2012-01-30 DIAGNOSIS — L820 Inflamed seborrheic keratosis: Secondary | ICD-10-CM | POA: Diagnosis not present

## 2012-01-30 DIAGNOSIS — L821 Other seborrheic keratosis: Secondary | ICD-10-CM | POA: Diagnosis not present

## 2012-01-30 DIAGNOSIS — D239 Other benign neoplasm of skin, unspecified: Secondary | ICD-10-CM | POA: Diagnosis not present

## 2012-03-25 DIAGNOSIS — C44519 Basal cell carcinoma of skin of other part of trunk: Secondary | ICD-10-CM | POA: Diagnosis not present

## 2012-04-30 DIAGNOSIS — I10 Essential (primary) hypertension: Secondary | ICD-10-CM | POA: Diagnosis not present

## 2012-04-30 DIAGNOSIS — I951 Orthostatic hypotension: Secondary | ICD-10-CM | POA: Diagnosis not present

## 2012-04-30 DIAGNOSIS — R42 Dizziness and giddiness: Secondary | ICD-10-CM | POA: Diagnosis not present

## 2012-04-30 DIAGNOSIS — Z853 Personal history of malignant neoplasm of breast: Secondary | ICD-10-CM | POA: Diagnosis not present

## 2012-04-30 DIAGNOSIS — Z85038 Personal history of other malignant neoplasm of large intestine: Secondary | ICD-10-CM | POA: Diagnosis not present

## 2012-04-30 DIAGNOSIS — Z79899 Other long term (current) drug therapy: Secondary | ICD-10-CM | POA: Diagnosis not present

## 2012-04-30 DIAGNOSIS — E7800 Pure hypercholesterolemia, unspecified: Secondary | ICD-10-CM | POA: Diagnosis not present

## 2012-05-06 DIAGNOSIS — C189 Malignant neoplasm of colon, unspecified: Secondary | ICD-10-CM | POA: Diagnosis not present

## 2012-05-06 DIAGNOSIS — I10 Essential (primary) hypertension: Secondary | ICD-10-CM | POA: Diagnosis not present

## 2012-05-06 DIAGNOSIS — K5730 Diverticulosis of large intestine without perforation or abscess without bleeding: Secondary | ICD-10-CM | POA: Diagnosis not present

## 2012-05-06 DIAGNOSIS — C50919 Malignant neoplasm of unspecified site of unspecified female breast: Secondary | ICD-10-CM | POA: Diagnosis not present

## 2012-05-06 DIAGNOSIS — D010 Carcinoma in situ of colon: Secondary | ICD-10-CM | POA: Diagnosis not present

## 2012-05-06 DIAGNOSIS — Z79899 Other long term (current) drug therapy: Secondary | ICD-10-CM | POA: Diagnosis not present

## 2012-05-26 ENCOUNTER — Encounter (INDEPENDENT_AMBULATORY_CARE_PROVIDER_SITE_OTHER): Payer: Self-pay | Admitting: Surgery

## 2012-05-26 ENCOUNTER — Ambulatory Visit (INDEPENDENT_AMBULATORY_CARE_PROVIDER_SITE_OTHER): Payer: Medicare Other | Admitting: Surgery

## 2012-05-26 VITALS — BP 142/84 | HR 64 | Temp 98.3°F | Resp 18 | Ht 65.0 in | Wt 137.4 lb

## 2012-05-26 DIAGNOSIS — Z853 Personal history of malignant neoplasm of breast: Secondary | ICD-10-CM | POA: Diagnosis not present

## 2012-05-26 DIAGNOSIS — Z85038 Personal history of other malignant neoplasm of large intestine: Secondary | ICD-10-CM

## 2012-05-26 NOTE — Progress Notes (Signed)
ASSESSMENT AND PLAN: 1.  Stage 1 carcinoma of right breast  Treated with mastectomy in 1992 by Dr. Kathie Rhodes. [facility].  Disease free.  2.  Stage 1 carcinoma of right colon  Treated with right colectomy 2001.  Disease free.  Plan one year follow-up.  To get colonoscopy at [facility].  3.  Diverticulitis.  Sigmoid colon.  Attack last November, 2011.  Has done well since then.   4.  BRCA 1/2 negative - March 2013.    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  She saw Dr. Para Skeans in [facility].  They have suggested that she have the colonoscopies at [facility], which is fine by me.  She has yet to schedule this.  Morgan Mullins is a 76 y.o. (DOB: [birth year])  white female who is a patient of EHINGER,ROBERT R, MD and comes to me today for followup of colon and breast cancer.  She is doing very well.  She is on a high fiber diet.  She has had no further diverticulitis.  See A&P regarding her genetics.  Current Outpatient Prescriptions on File Prior to Visit  Medication Sig Dispense Refill  . aspirin 81 MG tablet Take 81 mg by mouth daily.        Marland Kitchen CALCIUM-VITAMIN D PO Take by mouth daily.        . Cholecalciferol (VITAMIN D PO) Take 1,000 Units by mouth daily.        . dorzolamide-timolol (COSOPT) 22.3-6.8 MG/ML ophthalmic solution BID times 48H.      Marland Kitchen lisinopril (PRINIVIL,ZESTRIL) 10 MG tablet Take 10 mg by mouth daily.        . Multiple Vitamins-Minerals (CENTRUM SILVER PO) Take 1 tablet by mouth daily.        . simvastatin (ZOCOR) 40 MG tablet Take 40 mg by mouth at bedtime.       . Travoprost (TRAVATAN OP) Apply to eye.         PHYSICAL EXAM: BP 142/84  Pulse 64  Temp 98.3 F (36.8 C)  Resp 18  Ht 5\' 5"  (1.651 m)  Wt 137 lb 6.4 oz (62.324 kg)  BMI 22.86 kg/m2  HEENT: Normal. Pupils equal. Normal dentition. Neck: Supple. No thyroid mass. Lymph Nodes:  No supraclavicular, cervical nodes, or axillary node..  No evidence of lymphedema. Lungs:  Clear and symmetric. Heart:  RRR. No murmur. Breasts:  Right breast absent.  No mass.    Left breast unremarkable.  She does have a 1.8 cm seborrheic keratosis at 12 o'clock on the skin.  Abdomen: No mass. No tenderness. No hernia. Normal bowel sounds.  Scars look okay. Rectal: I did not do a rectal, because she is planning a colonoscopy at [facility]  DATA REVIEWED: Mammogram, [facility] - 2 Nov. 2012 - negative.

## 2012-06-11 DIAGNOSIS — Z23 Encounter for immunization: Secondary | ICD-10-CM | POA: Diagnosis not present

## 2012-06-29 DIAGNOSIS — Z9189 Other specified personal risk factors, not elsewhere classified: Secondary | ICD-10-CM | POA: Diagnosis not present

## 2012-06-29 DIAGNOSIS — Z01419 Encounter for gynecological examination (general) (routine) without abnormal findings: Secondary | ICD-10-CM | POA: Diagnosis not present

## 2012-06-29 DIAGNOSIS — Z124 Encounter for screening for malignant neoplasm of cervix: Secondary | ICD-10-CM | POA: Diagnosis not present

## 2012-07-29 DIAGNOSIS — Z09 Encounter for follow-up examination after completed treatment for conditions other than malignant neoplasm: Secondary | ICD-10-CM | POA: Diagnosis not present

## 2012-07-29 DIAGNOSIS — Z85038 Personal history of other malignant neoplasm of large intestine: Secondary | ICD-10-CM | POA: Diagnosis not present

## 2012-07-29 DIAGNOSIS — Z8500 Personal history of malignant neoplasm of unspecified digestive organ: Secondary | ICD-10-CM | POA: Diagnosis not present

## 2012-07-29 DIAGNOSIS — K319 Disease of stomach and duodenum, unspecified: Secondary | ICD-10-CM | POA: Diagnosis not present

## 2012-07-29 DIAGNOSIS — K648 Other hemorrhoids: Secondary | ICD-10-CM | POA: Diagnosis not present

## 2012-08-05 DIAGNOSIS — Z1231 Encounter for screening mammogram for malignant neoplasm of breast: Secondary | ICD-10-CM | POA: Diagnosis not present

## 2012-08-17 DIAGNOSIS — H4050X0 Glaucoma secondary to other eye disorders, unspecified eye, stage unspecified: Secondary | ICD-10-CM | POA: Diagnosis not present

## 2012-11-10 DIAGNOSIS — Z79899 Other long term (current) drug therapy: Secondary | ICD-10-CM | POA: Diagnosis not present

## 2012-11-10 DIAGNOSIS — E7800 Pure hypercholesterolemia, unspecified: Secondary | ICD-10-CM | POA: Diagnosis not present

## 2012-11-10 DIAGNOSIS — Z1331 Encounter for screening for depression: Secondary | ICD-10-CM | POA: Diagnosis not present

## 2012-11-10 DIAGNOSIS — Z853 Personal history of malignant neoplasm of breast: Secondary | ICD-10-CM | POA: Diagnosis not present

## 2012-11-10 DIAGNOSIS — I10 Essential (primary) hypertension: Secondary | ICD-10-CM | POA: Diagnosis not present

## 2012-11-10 DIAGNOSIS — Z85038 Personal history of other malignant neoplasm of large intestine: Secondary | ICD-10-CM | POA: Diagnosis not present

## 2013-01-29 DIAGNOSIS — H4050X0 Glaucoma secondary to other eye disorders, unspecified eye, stage unspecified: Secondary | ICD-10-CM | POA: Diagnosis not present

## 2013-02-03 ENCOUNTER — Other Ambulatory Visit: Payer: Self-pay | Admitting: Dermatology

## 2013-02-03 DIAGNOSIS — D485 Neoplasm of uncertain behavior of skin: Secondary | ICD-10-CM | POA: Diagnosis not present

## 2013-02-03 DIAGNOSIS — L570 Actinic keratosis: Secondary | ICD-10-CM | POA: Diagnosis not present

## 2013-02-03 DIAGNOSIS — D1801 Hemangioma of skin and subcutaneous tissue: Secondary | ICD-10-CM | POA: Diagnosis not present

## 2013-02-03 DIAGNOSIS — Z85828 Personal history of other malignant neoplasm of skin: Secondary | ICD-10-CM | POA: Diagnosis not present

## 2013-02-03 DIAGNOSIS — L821 Other seborrheic keratosis: Secondary | ICD-10-CM | POA: Diagnosis not present

## 2013-02-03 DIAGNOSIS — L259 Unspecified contact dermatitis, unspecified cause: Secondary | ICD-10-CM | POA: Diagnosis not present

## 2013-03-10 DIAGNOSIS — H4050X0 Glaucoma secondary to other eye disorders, unspecified eye, stage unspecified: Secondary | ICD-10-CM | POA: Diagnosis not present

## 2013-05-10 DIAGNOSIS — Z79899 Other long term (current) drug therapy: Secondary | ICD-10-CM | POA: Diagnosis not present

## 2013-05-10 DIAGNOSIS — I10 Essential (primary) hypertension: Secondary | ICD-10-CM | POA: Diagnosis not present

## 2013-05-10 DIAGNOSIS — E7800 Pure hypercholesterolemia, unspecified: Secondary | ICD-10-CM | POA: Diagnosis not present

## 2013-05-21 ENCOUNTER — Other Ambulatory Visit: Payer: Self-pay

## 2013-05-21 NOTE — Telephone Encounter (Signed)
Patients last AEX was 06/29/12-refilled x 1 year. Next AEX scheduled 09/02/13. Chart on your desk to confirm. Please advise if this is ok

## 2013-05-23 MED ORDER — CITALOPRAM HYDROBROMIDE 20 MG PO TABS: 20.0000 mg | ORAL_TABLET | Freq: Every day | ORAL | Status: DC

## 2013-06-18 ENCOUNTER — Ambulatory Visit (INDEPENDENT_AMBULATORY_CARE_PROVIDER_SITE_OTHER): Payer: Medicare Other | Admitting: Surgery

## 2013-06-24 ENCOUNTER — Encounter (INDEPENDENT_AMBULATORY_CARE_PROVIDER_SITE_OTHER): Payer: Self-pay

## 2013-06-24 ENCOUNTER — Ambulatory Visit (INDEPENDENT_AMBULATORY_CARE_PROVIDER_SITE_OTHER): Payer: Medicare Other | Admitting: Surgery

## 2013-06-24 ENCOUNTER — Encounter (INDEPENDENT_AMBULATORY_CARE_PROVIDER_SITE_OTHER): Payer: Self-pay | Admitting: Surgery

## 2013-06-24 VITALS — BP 152/86 | HR 60 | Temp 98.2°F | Resp 16 | Ht 66.0 in | Wt 133.2 lb

## 2013-06-24 DIAGNOSIS — Z85038 Personal history of other malignant neoplasm of large intestine: Secondary | ICD-10-CM | POA: Diagnosis not present

## 2013-06-24 DIAGNOSIS — Z853 Personal history of malignant neoplasm of breast: Secondary | ICD-10-CM

## 2013-06-24 NOTE — Progress Notes (Addendum)
Name:  Janise Gora MRN:  [id number]  ASSESSMENT AND PLAN: 1.  Stage 1 carcinoma of right breast  Treated with mastectomy in 1992 by Dr. Kathie Rhodes. [facility].  Disease free.  2.  Stage 1 carcinoma of right colon  Treated with right colectomy 2001.  Stricture at about 30 cm when I did her colonoscopy in 07/2003.  Disease free.  Colonoscopy last year in [facility].  No definitd time for repeat colonoscopy.  Plan one year follow-up.  [A letter from Dr. Cheron Schaumann to the patient (she made me a copy) said that they usually stop doing colonoscopies at age 53.  The other option would be follow up colonoscopy in 5 years, when she is 67.  In his summary, he did not think that she is high risk for further GI cancer.  DN  07/05/2013] [She dropped me a note with my picture as part of the Bariatric Team from the [facility].  DN 6/187/2015]  3.  Diverticulitis.  Sigmoid colon.  Attack last November, 2011.  Has done well since then.   4.  BRCA 1/2 negative - March 2013.    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  She saw Dr. Para Skeans in [facility].    Ashleyann Shoun is a 77 y.o. (DOB: 1936/01/09)  white female who is a patient of EHINGER,ROBERT R, MD and comes to me today for followup of colon and breast cancer.  She is doing very well.  She is on a high fiber diet.  She has had no further diverticulitis.  See A&P regarding her genetics.  Her sister lives in [place] and is coming to the beach in [place] in one week.  Current Outpatient Prescriptions on File Prior to Visit  Medication Sig Dispense Refill  . aspirin 81 MG tablet Take 81 mg by mouth daily.        Marland Kitchen CALCIUM-VITAMIN D PO Take by mouth daily.        . Cholecalciferol (VITAMIN D PO) Take 1,000 Units by mouth daily.        . citalopram (CELEXA) 20 MG tablet Take 1 tablet (20 mg total) by mouth daily.  30 tablet  3  . dorzolamide-timolol (COSOPT) 22.3-6.8 MG/ML ophthalmic solution BID times 48H.       Marland Kitchen lisinopril (PRINIVIL,ZESTRIL) 10 MG tablet Take 10 mg by mouth daily.        . Multiple Vitamins-Minerals (CENTRUM SILVER PO) Take 1 tablet by mouth daily.        . simvastatin (ZOCOR) 40 MG tablet Take 40 mg by mouth at bedtime.       . Travoprost (TRAVATAN OP) Apply to eye.         No current facility-administered medications on file prior to visit.   Social History: No children Sister lives in [place].  PHYSICAL EXAM: BP 152/86  Pulse 60  Temp(Src) 98.2 F (36.8 C) (Temporal)  Resp 16  Ht 5\' 6"  (1.676 m)  Wt 133 lb 3.2 oz (60.419 kg)  BMI 21.51 kg/m2  HEENT: Normal. Pupils equal. Normal dentition. Neck: Supple. No thyroid mass. Lymph Nodes:  No supraclavicular, cervical nodes, or axillary node..  No evidence of lymphedema. Lungs: Clear and symmetric. Heart:  RRR. No murmur. Breasts:  Right breast absent.  No mass.    Left breast unremarkable.  She does have a 1.8 cm seborrheic keratosis at 12 o'clock on the skin.  Abdomen: No mass. No tenderness. No hernia. Normal  bowel sounds.  Scars look okay. Rectal: I did not do a rectal, because of recent colonoscopy at [facility]  DATA REVIEWED: Mammogram, [facility] - 2 Nov. 2012 - negative.

## 2013-06-30 DIAGNOSIS — Z23 Encounter for immunization: Secondary | ICD-10-CM | POA: Diagnosis not present

## 2013-07-28 DIAGNOSIS — H4011X0 Primary open-angle glaucoma, stage unspecified: Secondary | ICD-10-CM | POA: Diagnosis not present

## 2013-07-28 DIAGNOSIS — Z961 Presence of intraocular lens: Secondary | ICD-10-CM | POA: Diagnosis not present

## 2013-07-28 DIAGNOSIS — H43819 Vitreous degeneration, unspecified eye: Secondary | ICD-10-CM | POA: Diagnosis not present

## 2013-08-13 ENCOUNTER — Emergency Department (HOSPITAL_COMMUNITY)
Admission: EM | Admit: 2013-08-13 | Discharge: 2013-08-13 | Disposition: A | Discharge status: Home / Self Care | Payer: Medicare Other | Attending: Emergency Medicine | Admitting: Emergency Medicine

## 2013-08-13 ENCOUNTER — Encounter (HOSPITAL_COMMUNITY): Payer: Self-pay | Admitting: Emergency Medicine

## 2013-08-13 ENCOUNTER — Emergency Department (HOSPITAL_COMMUNITY): Payer: Medicare Other

## 2013-08-13 DIAGNOSIS — Z7982 Long term (current) use of aspirin: Secondary | ICD-10-CM | POA: Diagnosis not present

## 2013-08-13 DIAGNOSIS — Z79899 Other long term (current) drug therapy: Secondary | ICD-10-CM | POA: Insufficient documentation

## 2013-08-13 DIAGNOSIS — Z85038 Personal history of other malignant neoplasm of large intestine: Secondary | ICD-10-CM | POA: Diagnosis not present

## 2013-08-13 DIAGNOSIS — Z862 Personal history of diseases of the blood and blood-forming organs and certain disorders involving the immune mechanism: Secondary | ICD-10-CM | POA: Insufficient documentation

## 2013-08-13 DIAGNOSIS — R404 Transient alteration of awareness: Secondary | ICD-10-CM | POA: Diagnosis not present

## 2013-08-13 DIAGNOSIS — R918 Other nonspecific abnormal finding of lung field: Secondary | ICD-10-CM | POA: Diagnosis not present

## 2013-08-13 DIAGNOSIS — Z8639 Personal history of other endocrine, nutritional and metabolic disease: Secondary | ICD-10-CM | POA: Insufficient documentation

## 2013-08-13 DIAGNOSIS — R55 Syncope and collapse: Secondary | ICD-10-CM | POA: Insufficient documentation

## 2013-08-13 DIAGNOSIS — I10 Essential (primary) hypertension: Secondary | ICD-10-CM | POA: Insufficient documentation

## 2013-08-13 DIAGNOSIS — R42 Dizziness and giddiness: Secondary | ICD-10-CM | POA: Diagnosis not present

## 2013-08-13 LAB — URINALYSIS, ROUTINE W REFLEX MICROSCOPIC
Glucose, UA: NEGATIVE mg/dL
Hgb urine dipstick: NEGATIVE
Leukocytes, UA: NEGATIVE
pH: 7 (ref 5.0–8.0)

## 2013-08-13 LAB — BASIC METABOLIC PANEL
BUN: 13 mg/dL (ref 6–23)
CO2: 21 mEq/L (ref 19–32)
Glucose, Bld: 133 mg/dL — ABNORMAL HIGH (ref 70–99)
Potassium: 3.6 mEq/L (ref 3.5–5.1)
Sodium: 135 mEq/L (ref 135–145)

## 2013-08-13 LAB — CBC
HCT: 35.4 % — ABNORMAL LOW (ref 36.0–46.0)
Hemoglobin: 12.7 g/dL (ref 12.0–15.0)
MCH: 31.7 pg (ref 26.0–34.0)
MCHC: 35.9 g/dL (ref 30.0–36.0)
RBC: 4.01 MIL/uL (ref 3.87–5.11)

## 2013-08-13 LAB — POCT I-STAT TROPONIN I: Troponin i, poc: 0 ng/mL (ref 0.00–0.08)

## 2013-08-13 NOTE — ED Provider Notes (Signed)
CSN: [id number]     Arrival date & time 08/13/13  1909 History   First MD Initiated Contact with Patient 08/13/13 1939     Chief Complaint  Patient presents with  . Loss of Consciousness   (Consider location/radiation/quality/duration/timing/severity/associated sxs/prior Treatment) HPI Comments:   77 year old female arrives with complaint syncope.patient reports she had returned from the mall this afternoon, she was standing in kitchen cooking when she began feeling slightly lightheaded. She went to kitchen table sat down took blood pressure found 80 systolic. Called her husband and rechecked and found 80 systolic again. Then had syncopal event. Husband reports she did not fall out of chair did not hit her head or any other injuries. Regain consciousness within 1 minute. No signs of seizure or shaking et Karie Soda.   Patient is a 77 y.o. female presenting with syncope.  Loss of Consciousness Episode history:  Single Most recent episode:  Today Timing:  Rare Progression:  Resolved Chronicity:  New Witnessed: yes   Relieved by:  Nothing Worsened by:  Nothing tried Ineffective treatments:  None tried Associated symptoms: no anxiety, no chest pain, no confusion, no diaphoresis, no difficulty breathing, no dizziness, no fever, no headaches, no shortness of breath and no weakness   Risk factors: no congenital heart disease, no coronary artery disease, no seizures and no vascular disease       Past Medical History  Diagnosis Date  . Cancer 2001    colon  . Hypertension   . Hyperlipidemia    Past Surgical History  Procedure Laterality Date  . Colon surgery  02/20/2000  . Cholecystectomy open  04/1984  . Breast surgery  09/1990    rt mastectomy   Family History  Problem Relation Age of Onset  . Cancer Mother     ovarian  . Cancer Father     colon  . Cancer Paternal Aunt     breast   History  Substance Use Topics  . Smoking status: Never Smoker   . Smokeless tobacco: Not on file   . Alcohol Use: No   OB History   Grav Para Term Preterm Abortions TAB SAB Ect Mult Living                 Review of Systems  Constitutional: Negative for fever, diaphoresis and fatigue.  Respiratory: Negative for shortness of breath.   Cardiovascular: Positive for syncope. Negative for chest pain.  Gastrointestinal: Negative for abdominal pain.  Genitourinary: Negative for dysuria.  Musculoskeletal: Negative for back pain.  Skin: Negative for rash.  Neurological: Positive for light-headedness. Negative for dizziness, weakness and headaches.  Psychiatric/Behavioral: Negative for confusion and agitation.  All other systems reviewed and are negative.    Allergies  Review of patient's allergies indicates no known allergies.  Home Medications   Current Outpatient Rx  Name  Route  Sig  Dispense  Refill  . aspirin 81 MG tablet   Oral   Take 81 mg by mouth daily.           Marland Kitchen CALCIUM-VITAMIN D PO   Oral   Take 1 tablet by mouth daily.          . Cholecalciferol (VITAMIN D PO)   Oral   Take 1,000 Units by mouth daily.           . citalopram (CELEXA) 20 MG tablet   Oral   Take 10 mg by mouth daily.         . dorzolamide-timolol (COSOPT) 22.3-6.8  MG/ML ophthalmic solution   Left Eye   Place 1 drop into the left eye 2 (two) times daily.          Marland Kitchen lisinopril (PRINIVIL,ZESTRIL) 10 MG tablet   Oral   Take 10 mg by mouth daily.           . Multiple Vitamins-Minerals (CENTRUM SILVER PO)   Oral   Take 1 tablet by mouth daily.           . simvastatin (ZOCOR) 40 MG tablet   Oral   Take 40 mg by mouth every morning.          . Travoprost (TRAVATAN OP)   Left Eye   Place 1 drop into the left eye daily.           BP 142/58  Pulse 53  Temp(Src) 97.8 F (36.6 C) (Oral)  Resp 18  SpO2 96% Physical Exam  Nursing note and vitals reviewed. Constitutional: She is oriented to person, place, and time. She appears well-developed and well-nourished.  HENT:   Head: Normocephalic and atraumatic.  Eyes: EOM are normal. Pupils are equal, round, and reactive to light.  Neck: Normal range of motion.  Cardiovascular: Regular rhythm and intact distal pulses.   Bradycardic  Pulmonary/Chest: Effort normal and breath sounds normal. No respiratory distress.  Abdominal: Soft. She exhibits no distension. There is no tenderness. There is no rebound.  Musculoskeletal: Normal range of motion. She exhibits no edema.  Neurological: She is alert and oriented to person, place, and time. She has normal reflexes. No cranial nerve deficit. She exhibits normal muscle tone. Coordination normal.  Skin: Skin is warm and dry.  Psychiatric: She has a normal mood and affect. Her behavior is normal. Judgment and thought content normal.    ED Course  Procedures (including critical care time) Labs Review Labs Reviewed  CBC - Abnormal; Notable for the following:    HCT 35.4 (*)    All other components within normal limits  BASIC METABOLIC PANEL - Abnormal; Notable for the following:    Glucose, Bld 133 (*)    GFR calc non Af Amer 58 (*)    GFR calc Af Amer 67 (*)    All other components within normal limits  GLUCOSE, CAPILLARY - Abnormal; Notable for the following:    Glucose-Capillary 117 (*)    All other components within normal limits  URINALYSIS, ROUTINE W REFLEX MICROSCOPIC  POCT I-STAT TROPONIN I   Imaging Review Dg Chest Portable 1 View  08/13/2013   CLINICAL DATA:  Loss of consciousness  EXAM: PORTABLE CHEST - 1 VIEW  COMPARISON:  Report from chest radiograph dated 07/03/2001. These images are not available for review.  FINDINGS: There cardiac leads projection of the chest. Patient status Arantxa Piercey right hepatectomy. Surgical clips are seen in the right axilla. The heart size appears upper normal for portable technique. Mediastinal and hilar contours are within normal limits. Both lungs are clear. The visualized skeletal structures are unremarkable. Cholecystectomy  clips are seen.  IMPRESSION: No acute cardiopulmonary disease identified. Borderline cardiomegaly for portable technique.   Electronically Signed   By: Britta Mccreedy M.D.   On: 08/13/2013 19:56    EKG Interpretation   None       MDM   1. Syncope    77 year old female with syncopal episode today. Patient does report taking blood pressure during presyncopal episode immediately before syncopal episode and found systolic pressure of 80. Likely low systolic pressures causing this. In  the emergency department patient having systolics greater than 120. Patient is bradycardic in the emergency department. Upon inspection previous clinic and ED visits found patient's heart rate always in this range. Patient denies any chest pain shortness of breath or other symptoms associated with the syncopal episode. EKG sinus bradycardia. No overt ischemic changes noted. No arrhythmias. No evidence of WPW, Brugada et Karie Soda. Doubt any serious or cardiac issue. No sign of infectious cause. Patient is afebrile. Vital signs are stable. UA clear. Chest x-ray normal. Patient not anemic. Normal BNP. Not diabetic normal glucose. Suspect syncope secondary to hypotension. Patient reports being otherwise healthy. She requests to go home and followup with her PCP. Patient was able to ambulate with without any presyncopal or syncopal episodes. Having no shortness of breath chest pain or dizziness. Believe this is reasonable plan given likely identified cause which is now resolved. Remained stable in the emergency department until time of discharge.   Bridgett Larsson, MD 08/13/13 [phone]

## 2013-08-13 NOTE — ED Notes (Addendum)
Onset today at home feeling general weakness sat down on chair in dining room.  Husband left room to call 911 and patient had syncope event couple of minutes and was on floor of dining room. No injury noted by patient and EMS.  BP 110/62 supine and sat patient up BP 58/Palpation. HR 47 CBG 87. Alert answering and following commands appropriate. EMS gave 0.9 NS bolus.

## 2013-08-14 NOTE — ED Provider Notes (Signed)
Medical screening examination/treatment/procedure(s) were conducted as a shared visit with non-physician practitioner(s) or resident and myself. I personally evaluated the patient during the encounter and agree with the findings and plan unless otherwise indicated.  I have personally reviewed any xrays and/ or EKG's with the provider and I agree with interpretation.  Health female with lightheaded followed by syncope. No hx of similar except with painful stimulus. Pt had nausea/ lightheaded first, bp was 80s. Pt has no sxs currently, vitals back at pt baseline mild htn. No change in meds, no bleeding, no known valve hx. No murmur on exam. Discussed observation for tele/ echo in am verus close fup outpt.  Patient chose to fup closely outpt, reasons to return were given.   Syncope, Lightheaded  Enid Skeens, MD 08/14/13 0025

## 2013-08-18 DIAGNOSIS — R55 Syncope and collapse: Secondary | ICD-10-CM | POA: Diagnosis not present

## 2013-09-01 ENCOUNTER — Encounter: Payer: Self-pay | Admitting: Obstetrics & Gynecology

## 2013-09-01 DIAGNOSIS — Z1231 Encounter for screening mammogram for malignant neoplasm of breast: Secondary | ICD-10-CM | POA: Diagnosis not present

## 2013-09-02 ENCOUNTER — Encounter: Payer: Self-pay | Admitting: Obstetrics & Gynecology

## 2013-09-02 ENCOUNTER — Ambulatory Visit (INDEPENDENT_AMBULATORY_CARE_PROVIDER_SITE_OTHER): Payer: Medicare Other | Admitting: Obstetrics & Gynecology

## 2013-09-02 VITALS — BP 122/70 | HR 64 | Resp 16 | Ht 64.75 in | Wt 133.0 lb

## 2013-09-02 DIAGNOSIS — Z124 Encounter for screening for malignant neoplasm of cervix: Secondary | ICD-10-CM

## 2013-09-02 DIAGNOSIS — Z01419 Encounter for gynecological examination (general) (routine) without abnormal findings: Secondary | ICD-10-CM

## 2013-09-02 MED ORDER — CITALOPRAM HYDROBROMIDE 20 MG PO TABS: 20.0000 mg | ORAL_TABLET | Freq: Every day | ORAL | Status: DC

## 2013-09-02 MED ORDER — ALPRAZOLAM 0.25 MG PO TABS: 0.2500 mg | ORAL_TABLET | Freq: Every evening | ORAL | Status: DC | PRN

## 2013-09-02 NOTE — Patient Instructions (Signed)

## 2013-09-02 NOTE — Progress Notes (Signed)
77 y.o. G0P0000 MarriedCaucasianF here for annual exam.  No vaginal bleeding.  Doing well.  Just did MMG yesterday.  Had syncopal episode 08/13/13.  Was monitored at [facility].  Had EKG.  Has seen Dr. Manus Gunning.  Pt feels like she just overdid it.    Patient's last menstrual period was 09/17/1987.          Sexually active: no  The current method of family planning is post menopausal status.    Exercising: yes   occ hand weights, squats Smoker:  no  Health Maintenance: Pap: 06/29/12 neg History of abnormal Pap:  no MMG:  08/02/13 ([facility]) Colonoscopy:  07/30/12 normal, done at [facility].  EGD was negative as well. BMD:   07/31/10 Mild Osteopenia TDaP:  2006 Screening Labs: Dr. Manus Gunning, Hb today:pcp, Urine today: pcp   reports that she has never smoked. She has never used smokeless tobacco. She reports that she does not drink alcohol or use illicit drugs.  Past Medical History  Diagnosis Date  . Cancer 2001    colon  . Hypertension   . Hyperlipidemia   . Breast cancer 1/92    right mastectomy  . Colon polyps   . DJD (degenerative joint disease)     arthritis in neck  . Glaucoma   . Osteopenia   . Infertility     Past Surgical History  Procedure Laterality Date  . Colon surgery  02/20/2000    h/o colon resection secondary to villous adenoma  . Cholecystectomy open  04/1984  . Breast surgery  09/1990    rt mastectomy  . Tubal ligation    . Cataract extraction      left 10/02, right 2003    Current Outpatient Prescriptions  Medication Sig Dispense Refill  . ALPRAZolam (XANAX) 0.25 MG tablet Take 0.25 mg by mouth as needed for anxiety.      Marland Kitchen aspirin 81 MG tablet Take 81 mg by mouth daily.        Marland Kitchen CALCIUM-VITAMIN D PO Take 600 mg by mouth daily.       . Cholecalciferol (VITAMIN D PO) Take 1,000 Units by mouth daily.        . citalopram (CELEXA) 20 MG tablet Take 10 mg by mouth daily.      . dorzolamide-timolol (COSOPT) 22.3-6.8 MG/ML ophthalmic solution Place 1 drop into  the left eye 2 (two) times daily.       Marland Kitchen lisinopril-hydrochlorothiazide (PRINZIDE,ZESTORETIC) 20-12.5 MG per tablet Take 1 tablet by mouth daily.      . Multiple Vitamins-Minerals (CENTRUM SILVER PO) Take 1 tablet by mouth daily.        . simvastatin (ZOCOR) 40 MG tablet Take 40 mg by mouth every morning.       . Travoprost (TRAVATAN OP) Place 1 drop into the left eye daily.        No current facility-administered medications for this visit.    Family History  Problem Relation Age of Onset  . Cancer Mother     ovarian  . Cancer Father     colon  . Cancer Paternal Aunt     breast    ROS:  Pertinent items are noted in HPI.  Otherwise, a comprehensive ROS was negative.  Exam:   BP 122/70  Pulse 64  Resp 16  Ht 5' 4.75" (1.645 m)  Wt 133 lb (60.328 kg)  BMI 22.29 kg/m2  LMP 09/17/1987  Weight change: +1   Height: 5' 4.75" (164.5 cm)  Ht Readings from Last 3 Encounters:  09/02/13 5' 4.75" (1.645 m)  06/24/13 5\' 6"  (1.676 m)  05/26/12 5\' 5"  (1.651 m)    General appearance: alert, cooperative and appears stated age Head: Normocephalic, without obvious abnormality, atraumatic Neck: no adenopathy, supple, symmetrical, trachea midline and thyroid normal to inspection and palpation Lungs: clear to auscultation bilaterally Breasts: absent right breast and 1cm nodule in axilla, stable for several years, left breast stable Heart: regular rate and rhythm Abdomen: soft, non-tender; bowel sounds normal; no masses,  no organomegaly Extremities: extremities normal, atraumatic, no cyanosis or edema Skin: Skin color, texture, turgor normal. No rashes or lesions Lymph nodes: Cervical, supraclavicular, and axillary nodes normal. No abnormal inguinal nodes palpated Neurologic: Grossly normal   Pelvic: External genitalia:  no lesions              Urethra:  normal appearing urethra with no masses, tenderness or lesions              Bartholins and Skenes: normal                 Vagina:  normal appearing vagina with normal color and discharge, no lesions              Cervix: no lesions              Pap taken: no Bimanual Exam:  Uterus:  normal size, contour, position, consistency, mobility, non-tender              Adnexa: normal adnexa and no mass, fullness, tenderness               Rectovaginal: Confirms               Anus:  normal sphincter tone, no lesions  A:  Well Woman with normal exam H/O breast cancer s/p right mastectomy 1992 (negative BRCA 1/2 in 2012) Anxiety 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  Normal 2013 Celexa 20mg  daily. Rx to pharmacy. Xanax 0.25mg  prn #30/1RD Labs with PCP return annually or prn  An After Visit Summary was printed and given to the patient.

## 2013-09-20 DIAGNOSIS — H4050X0 Glaucoma secondary to other eye disorders, unspecified eye, stage unspecified: Secondary | ICD-10-CM | POA: Diagnosis not present

## 2013-09-20 DIAGNOSIS — H409 Unspecified glaucoma: Secondary | ICD-10-CM | POA: Diagnosis not present

## 2013-11-05 DIAGNOSIS — E7800 Pure hypercholesterolemia, unspecified: Secondary | ICD-10-CM | POA: Diagnosis not present

## 2013-11-05 DIAGNOSIS — Z79899 Other long term (current) drug therapy: Secondary | ICD-10-CM | POA: Diagnosis not present

## 2013-11-05 DIAGNOSIS — I10 Essential (primary) hypertension: Secondary | ICD-10-CM | POA: Diagnosis not present

## 2014-02-15 ENCOUNTER — Ambulatory Visit
Admission: RE | Admit: 2014-02-15 | Discharge: 2014-02-15 | Disposition: A | Discharge status: Home / Self Care | Payer: Medicare Other | Source: Ambulatory Visit | Attending: Family Medicine | Admitting: Family Medicine

## 2014-02-15 ENCOUNTER — Encounter (INDEPENDENT_AMBULATORY_CARE_PROVIDER_SITE_OTHER): Payer: Self-pay

## 2014-02-15 ENCOUNTER — Other Ambulatory Visit: Payer: Self-pay | Admitting: Family Medicine

## 2014-02-15 DIAGNOSIS — R52 Pain, unspecified: Secondary | ICD-10-CM

## 2014-02-15 DIAGNOSIS — M79609 Pain in unspecified limb: Secondary | ICD-10-CM | POA: Diagnosis not present

## 2014-02-18 ENCOUNTER — Other Ambulatory Visit: Payer: Self-pay | Admitting: Family Medicine

## 2014-02-18 DIAGNOSIS — M79606 Pain in leg, unspecified: Secondary | ICD-10-CM

## 2014-02-22 ENCOUNTER — Ambulatory Visit
Admission: RE | Admit: 2014-02-22 | Discharge: 2014-02-22 | Disposition: A | Discharge status: Home / Self Care | Payer: Medicare Other | Source: Ambulatory Visit | Attending: Family Medicine | Admitting: Family Medicine

## 2014-02-22 DIAGNOSIS — M79609 Pain in unspecified limb: Secondary | ICD-10-CM | POA: Diagnosis not present

## 2014-02-22 DIAGNOSIS — M79606 Pain in leg, unspecified: Secondary | ICD-10-CM

## 2014-03-02 DIAGNOSIS — M7750 Other enthesopathy of unspecified foot: Secondary | ICD-10-CM | POA: Diagnosis not present

## 2014-03-02 DIAGNOSIS — M25579 Pain in unspecified ankle and joints of unspecified foot: Secondary | ICD-10-CM | POA: Diagnosis not present

## 2014-03-02 DIAGNOSIS — M79609 Pain in unspecified limb: Secondary | ICD-10-CM | POA: Diagnosis not present

## 2014-03-03 DIAGNOSIS — M25579 Pain in unspecified ankle and joints of unspecified foot: Secondary | ICD-10-CM | POA: Diagnosis not present

## 2014-03-08 DIAGNOSIS — H4050X0 Glaucoma secondary to other eye disorders, unspecified eye, stage unspecified: Secondary | ICD-10-CM | POA: Diagnosis not present

## 2014-03-09 DIAGNOSIS — M25579 Pain in unspecified ankle and joints of unspecified foot: Secondary | ICD-10-CM | POA: Diagnosis not present

## 2014-03-11 DIAGNOSIS — M25579 Pain in unspecified ankle and joints of unspecified foot: Secondary | ICD-10-CM | POA: Diagnosis not present

## 2014-03-14 DIAGNOSIS — M25579 Pain in unspecified ankle and joints of unspecified foot: Secondary | ICD-10-CM | POA: Diagnosis not present

## 2014-04-04 DIAGNOSIS — H4050X0 Glaucoma secondary to other eye disorders, unspecified eye, stage unspecified: Secondary | ICD-10-CM | POA: Diagnosis not present

## 2014-05-06 DIAGNOSIS — E7800 Pure hypercholesterolemia, unspecified: Secondary | ICD-10-CM | POA: Diagnosis not present

## 2014-05-06 DIAGNOSIS — Z23 Encounter for immunization: Secondary | ICD-10-CM | POA: Diagnosis not present

## 2014-05-06 DIAGNOSIS — I10 Essential (primary) hypertension: Secondary | ICD-10-CM | POA: Diagnosis not present

## 2014-06-23 DIAGNOSIS — Z23 Encounter for immunization: Secondary | ICD-10-CM | POA: Diagnosis not present

## 2014-08-01 DIAGNOSIS — M549 Dorsalgia, unspecified: Secondary | ICD-10-CM | POA: Diagnosis not present

## 2014-08-01 DIAGNOSIS — R1032 Left lower quadrant pain: Secondary | ICD-10-CM | POA: Diagnosis not present

## 2014-08-08 ENCOUNTER — Other Ambulatory Visit: Payer: Self-pay | Admitting: Family Medicine

## 2014-08-08 DIAGNOSIS — R109 Unspecified abdominal pain: Secondary | ICD-10-CM | POA: Diagnosis not present

## 2014-08-08 DIAGNOSIS — R1032 Left lower quadrant pain: Secondary | ICD-10-CM

## 2014-08-08 DIAGNOSIS — R1013 Epigastric pain: Secondary | ICD-10-CM

## 2014-08-09 ENCOUNTER — Ambulatory Visit
Admission: RE | Admit: 2014-08-09 | Discharge: 2014-08-09 | Disposition: A | Discharge status: Home / Self Care | Payer: Medicare Other | Source: Ambulatory Visit | Attending: Family Medicine | Admitting: Family Medicine

## 2014-08-09 DIAGNOSIS — R1013 Epigastric pain: Secondary | ICD-10-CM

## 2014-08-09 DIAGNOSIS — K760 Fatty (change of) liver, not elsewhere classified: Secondary | ICD-10-CM | POA: Diagnosis not present

## 2014-08-09 DIAGNOSIS — Z85038 Personal history of other malignant neoplasm of large intestine: Secondary | ICD-10-CM | POA: Diagnosis not present

## 2014-08-09 DIAGNOSIS — R1032 Left lower quadrant pain: Secondary | ICD-10-CM

## 2014-08-09 DIAGNOSIS — K5730 Diverticulosis of large intestine without perforation or abscess without bleeding: Secondary | ICD-10-CM | POA: Diagnosis not present

## 2014-08-09 MED ORDER — IOHEXOL 300 MG/ML  SOLN
100.0000 mL | Freq: Once | INTRAMUSCULAR | Status: AC | PRN
  Administered 2014-08-09: 100 mL via INTRAVENOUS

## 2014-09-13 DIAGNOSIS — Z853 Personal history of malignant neoplasm of breast: Secondary | ICD-10-CM | POA: Diagnosis not present

## 2014-09-13 DIAGNOSIS — Z1231 Encounter for screening mammogram for malignant neoplasm of breast: Secondary | ICD-10-CM | POA: Diagnosis not present

## 2014-09-26 DIAGNOSIS — H4052X1 Glaucoma secondary to other eye disorders, left eye, mild stage: Secondary | ICD-10-CM | POA: Diagnosis not present

## 2014-10-06 ENCOUNTER — Ambulatory Visit (INDEPENDENT_AMBULATORY_CARE_PROVIDER_SITE_OTHER): Payer: 59 | Admitting: Obstetrics & Gynecology

## 2014-10-06 VITALS — BP 118/80 | HR 82 | Resp 16 | Ht 65.0 in | Wt 131.2 lb

## 2014-10-06 DIAGNOSIS — Z124 Encounter for screening for malignant neoplasm of cervix: Secondary | ICD-10-CM | POA: Diagnosis not present

## 2014-10-06 DIAGNOSIS — Z01419 Encounter for gynecological examination (general) (routine) without abnormal findings: Secondary | ICD-10-CM

## 2014-10-06 DIAGNOSIS — Z0000 Encounter for general adult medical examination without abnormal findings: Secondary | ICD-10-CM

## 2014-10-06 LAB — POCT URINALYSIS DIPSTICK
Leukocytes, UA: NEGATIVE
Urobilinogen, UA: NEGATIVE
pH, UA: 5

## 2014-10-06 MED ORDER — CITALOPRAM HYDROBROMIDE 20 MG PO TABS: 20.0000 mg | ORAL_TABLET | Freq: Every day | ORAL | Status: DC

## 2014-10-06 MED ORDER — ALPRAZOLAM 0.25 MG PO TABS: 0.2500 mg | ORAL_TABLET | Freq: Every evening | ORAL | Status: DC | PRN

## 2014-10-06 NOTE — Progress Notes (Signed)
79 y.o. G0P0000 MarriedCaucasianF here for annual exam.  Doing well.  No vaginal bleeding.  Seeing Dr. Lucia Gaskins next week.  BMD due this year.  Can do with MMG in the fall.    PCP:  Dr. Marisue Humble, goes every six month.    Patient's last menstrual period was 09/17/1987.          Sexually active: No.  The current method of family planning is post menopausal status.    Exercising: No.  The patient does not participate in regular exercise at present. Smoker:  no  Health Maintenance: Pap:  06/29/2012 Negative History of abnormal Pap:  no MMG:  08/2014- WNL (Dr. Isaiah Blakes per pt.) Colonoscopy:  07/2012- Normal f/u in 5 years per pt.  Done at [facility].  Reviewed care everywhere. BMD:   07/2010 -1.2/-1.9 frax 2.4/11% TDaP:  Within last 4 years; 2012 Screening Labs: no, Hb today: Done by Gaynelle Arabian, MD  Urine today: Negative   reports that she has never smoked. She has never used smokeless tobacco. She reports that she does not drink alcohol or use illicit drugs.  Past Medical History  Diagnosis Date  . Cancer 2001    colon  . Hypertension   . Hyperlipidemia   . Breast cancer 1/92    right mastectomy  . Colon polyps   . DJD (degenerative joint disease)     arthritis in neck  . Glaucoma   . Osteopenia   . Infertility     Past Surgical History  Procedure Laterality Date  . Colon surgery  02/20/2000    h/o colon resection secondary to villous adenoma  . Cholecystectomy open  04/1984  . Breast surgery  09/1990    rt mastectomy  . Tubal ligation    . Cataract extraction      left 10/02, right 2003    Current Outpatient Prescriptions  Medication Sig Dispense Refill  . ALPRAZolam (XANAX) 0.25 MG tablet Take 1 tablet (0.25 mg total) by mouth at bedtime as needed for anxiety. 30 tablet 1  . aspirin 81 MG tablet Take 81 mg by mouth daily.      Marland Kitchen CALCIUM-VITAMIN D PO Take 600 mg by mouth daily.     . citalopram (CELEXA) 20 MG tablet Take 1 tablet (20 mg total) by mouth daily. 30 tablet 12  .  dorzolamide-timolol (COSOPT) 22.3-6.8 MG/ML ophthalmic solution Place 1 drop into the left eye 2 (two) times daily. Patient takes 1 in the morning and 1 in the evening.    Marland Kitchen lisinopril-hydrochlorothiazide (PRINZIDE,ZESTORETIC) 20-12.5 MG per tablet Take 1 tablet by mouth daily.    . Multiple Vitamins-Minerals (CENTRUM SILVER PO) Take 1 tablet by mouth daily.      . simvastatin (ZOCOR) 40 MG tablet Take 40 mg by mouth every morning.     . TRAVATAN Z 0.004 % SOLN ophthalmic solution   11  . Travoprost (TRAVATAN OP) Place 1 drop into the left eye daily.     Marland Kitchen zolpidem (AMBIEN) 10 MG tablet as needed.  0  . Cholecalciferol (VITAMIN D PO) Take 1,000 Units by mouth daily.       No current facility-administered medications for this visit.    Family History  Problem Relation Age of Onset  . Cancer Mother     ovarian  . Cancer Father     colon  . Cancer Paternal Aunt     breast    ROS:  Pertinent items are noted in HPI.  Otherwise, a comprehensive ROS  was negative.  Exam:   BP 118/80 mmHg  Pulse 82  Resp 16  Ht _0  (1.651 m)  Wt 131 lb 3.2 oz (59.512 kg)  BMI 21.83 kg/m2  LMP 09/17/1987  Height: _1  (165.1 cm)  Ht Readings from Last 3 Encounters:  10/06/14 _2  (1.651 m)  09/02/13 5' 4.75" (1.645 m)  06/24/13 _3  (1.676 m)    General appearance: alert, cooperative and appears stated age Head: Normocephalic, without obvious abnormality, atraumatic Neck: no adenopathy, supple, symmetrical, trachea midline and thyroid normal to inspection and palpation Lungs: clear to auscultation bilaterally Breasts: normal appearance, no masses or tenderness Heart: regular rate and rhythm Abdomen: soft, non-tender; bowel sounds normal; no masses,  no organomegaly Extremities: extremities normal, atraumatic, no cyanosis or edema Skin: Skin color, texture, turgor normal. No rashes or lesions Lymph nodes: Cervical, supraclavicular, and axillary nodes normal. No abnormal inguinal nodes  palpated Neurologic: Grossly normal   Pelvic: External genitalia:  no lesions              Urethra:  normal appearing urethra with no masses, tenderness or lesions              Bartholins and Skenes: normal                 Vagina: normal appearing vagina with normal color and discharge, no lesions              Cervix: no lesions              Pap taken: Yes.   Bimanual Exam:  Uterus:  normal size, contour, position, consistency, mobility, non-tender              Adnexa: normal adnexa and no mass, fullness, tenderness               Rectovaginal: Confirms               Anus:  normal sphincter tone, no lesions  Chaperone was present for exam.  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  11 inches removed. Hypertension Osteopenia  P: Mammogram yearly pap smear today. Celexa 30m daily. Rx to pharmacy. Xanax 0.211mprn #30/1RD Labs with PCP return annually or prn

## 2014-10-07 ENCOUNTER — Other Ambulatory Visit: Payer: Self-pay | Admitting: Obstetrics & Gynecology

## 2014-10-10 LAB — IPS PAP SMEAR ONLY

## 2014-10-13 ENCOUNTER — Encounter: Payer: Self-pay | Admitting: Obstetrics & Gynecology

## 2014-10-13 DIAGNOSIS — Z85038 Personal history of other malignant neoplasm of large intestine: Secondary | ICD-10-CM | POA: Diagnosis not present

## 2014-10-13 DIAGNOSIS — Z853 Personal history of malignant neoplasm of breast: Secondary | ICD-10-CM | POA: Diagnosis not present

## 2014-11-07 DIAGNOSIS — H409 Unspecified glaucoma: Secondary | ICD-10-CM | POA: Diagnosis not present

## 2014-11-07 DIAGNOSIS — I10 Essential (primary) hypertension: Secondary | ICD-10-CM | POA: Diagnosis not present

## 2014-11-07 DIAGNOSIS — E780 Pure hypercholesterolemia: Secondary | ICD-10-CM | POA: Diagnosis not present

## 2015-02-17 DIAGNOSIS — H4052X1 Glaucoma secondary to other eye disorders, left eye, mild stage: Secondary | ICD-10-CM | POA: Diagnosis not present

## 2015-03-28 ENCOUNTER — Encounter: Payer: Self-pay | Admitting: Genetic Counselor

## 2015-04-17 DIAGNOSIS — H4052X1 Glaucoma secondary to other eye disorders, left eye, mild stage: Secondary | ICD-10-CM | POA: Diagnosis not present

## 2015-05-08 DIAGNOSIS — I10 Essential (primary) hypertension: Secondary | ICD-10-CM | POA: Diagnosis not present

## 2015-05-08 DIAGNOSIS — Z1389 Encounter for screening for other disorder: Secondary | ICD-10-CM | POA: Diagnosis not present

## 2015-05-08 DIAGNOSIS — H409 Unspecified glaucoma: Secondary | ICD-10-CM | POA: Diagnosis not present

## 2015-05-08 DIAGNOSIS — D692 Other nonthrombocytopenic purpura: Secondary | ICD-10-CM | POA: Diagnosis not present

## 2015-05-08 DIAGNOSIS — E780 Pure hypercholesterolemia: Secondary | ICD-10-CM | POA: Diagnosis not present

## 2015-06-27 DIAGNOSIS — Z23 Encounter for immunization: Secondary | ICD-10-CM | POA: Diagnosis not present

## 2015-10-02 DIAGNOSIS — Z1231 Encounter for screening mammogram for malignant neoplasm of breast: Secondary | ICD-10-CM | POA: Diagnosis not present

## 2015-10-02 DIAGNOSIS — Z853 Personal history of malignant neoplasm of breast: Secondary | ICD-10-CM | POA: Diagnosis not present

## 2015-10-25 DIAGNOSIS — H4052X1 Glaucoma secondary to other eye disorders, left eye, mild stage: Secondary | ICD-10-CM | POA: Diagnosis not present

## 2015-11-07 DIAGNOSIS — E7800 Pure hypercholesterolemia, unspecified: Secondary | ICD-10-CM | POA: Diagnosis not present

## 2015-11-07 DIAGNOSIS — D692 Other nonthrombocytopenic purpura: Secondary | ICD-10-CM | POA: Diagnosis not present

## 2015-11-07 DIAGNOSIS — I10 Essential (primary) hypertension: Secondary | ICD-10-CM | POA: Diagnosis not present

## 2015-11-07 DIAGNOSIS — H409 Unspecified glaucoma: Secondary | ICD-10-CM | POA: Diagnosis not present

## 2015-11-13 ENCOUNTER — Other Ambulatory Visit: Payer: Self-pay | Admitting: Obstetrics & Gynecology

## 2015-11-14 NOTE — Telephone Encounter (Signed)
Medication refill request: Celexa 20mg  Last AEX:  10/06/14/ Next AEX: 12/08/15 Last MMG (if hormonal medication request): 09/13/14- 3D (left): Category C, Bi-Rads 2 Benign  Refill authorized: Please approve or deny request for medication. Thank you.

## 2015-11-15 DIAGNOSIS — Z85038 Personal history of other malignant neoplasm of large intestine: Secondary | ICD-10-CM | POA: Diagnosis not present

## 2015-11-15 DIAGNOSIS — Z853 Personal history of malignant neoplasm of breast: Secondary | ICD-10-CM | POA: Diagnosis not present

## 2015-12-08 ENCOUNTER — Ambulatory Visit (INDEPENDENT_AMBULATORY_CARE_PROVIDER_SITE_OTHER): Payer: Medicare Other | Admitting: Obstetrics & Gynecology

## 2015-12-08 ENCOUNTER — Encounter: Payer: Self-pay | Admitting: Obstetrics & Gynecology

## 2015-12-08 VITALS — BP 126/80 | HR 56 | Resp 18 | Ht 65.0 in | Wt 134.0 lb

## 2015-12-08 DIAGNOSIS — Z124 Encounter for screening for malignant neoplasm of cervix: Secondary | ICD-10-CM | POA: Diagnosis not present

## 2015-12-08 DIAGNOSIS — Z0000 Encounter for general adult medical examination without abnormal findings: Secondary | ICD-10-CM | POA: Diagnosis not present

## 2015-12-08 DIAGNOSIS — Z01419 Encounter for gynecological examination (general) (routine) without abnormal findings: Secondary | ICD-10-CM

## 2015-12-08 LAB — POCT URINALYSIS DIPSTICK
Bilirubin, UA: NEGATIVE
Glucose, UA: NEGATIVE
Ketones, UA: NEGATIVE
LEUKOCYTES UA: NEGATIVE
Nitrite, UA: NEGATIVE
PH UA: 5
PROTEIN UA: NEGATIVE
RBC UA: NEGATIVE
Urobilinogen, UA: NEGATIVE

## 2015-12-08 MED ORDER — ALPRAZOLAM 0.25 MG PO TABS: 0.2500 mg | ORAL_TABLET | Freq: Every evening | ORAL | Status: DC | PRN

## 2015-12-08 MED ORDER — CITALOPRAM HYDROBROMIDE 20 MG PO TABS: 20.0000 mg | ORAL_TABLET | Freq: Every day | ORAL | Status: DC

## 2015-12-08 NOTE — Progress Notes (Signed)
80 y.o. G0P0000 MarriedCaucasianF here for annual exam.  Doing well.  Denies vaginal bleeding.    PCP:  Dr. Marisue Humble.   Saw him earlier this year.  Did blood work then.  Pt reports this was all ok.  Patient's last menstrual period was 09/17/1987.          Sexually active: No.  The current method of family planning is post menopausal status.    Exercising: No.  The patient does not participate in regular exercise at present. Smoker:  no  Health Maintenance: Pap:  10/06/14 neg History of abnormal Pap:  no MMG:  09/13/14 Left BIRADS2:Benign  Colonoscopy:  07/2012 Normal - repeat 5 years  BMD:   07/2010  TDaP:  ~2012  Screening Labs: PCP, Urine today: pending    reports that she has never smoked. She has never used smokeless tobacco. She reports that she does not drink alcohol or use illicit drugs.  Past Medical History  Diagnosis Date  . Cancer [facility]) 2001    colon  . Hypertension   . Hyperlipidemia   . Breast cancer ([place]) 1/92    right mastectomy  . Colon polyps   . DJD (degenerative joint disease)     arthritis in neck  . Glaucoma   . Osteopenia   . Infertility     Past Surgical History  Procedure Laterality Date  . Colon surgery  02/20/2000    h/o colon resection secondary to villous adenoma  . Cholecystectomy open  04/1984  . Breast surgery  09/1990    rt mastectomy  . Tubal ligation    . Cataract extraction      left 10/02, right 2003    Current Outpatient Prescriptions  Medication Sig Dispense Refill  . ALPRAZolam (XANAX) 0.25 MG tablet Take 1 tablet (0.25 mg total) by mouth at bedtime as needed for anxiety. 30 tablet 1  . aspirin 81 MG tablet Take 81 mg by mouth daily.      Marland Kitchen CALCIUM-VITAMIN D PO Take 600 mg by mouth daily.     . Cholecalciferol (VITAMIN D PO) Take 1,000 Units by mouth daily.      . citalopram (CELEXA) 20 MG tablet TAKE 1 TABLET BY MOUTH EVERY DAY 30 tablet 0  . dorzolamide-timolol (COSOPT) 22.3-6.8 MG/ML ophthalmic solution Place 1 drop into the  left eye 2 (two) times daily. Patient takes 1 in the morning and 1 in the evening.    Marland Kitchen lisinopril-hydrochlorothiazide (PRINZIDE,ZESTORETIC) 20-12.5 MG per tablet Take 1 tablet by mouth daily.    . Multiple Vitamins-Minerals (CENTRUM SILVER PO) Take 1 tablet by mouth daily.      . simvastatin (ZOCOR) 40 MG tablet Take 40 mg by mouth every morning.     . TRAVATAN Z 0.004 % SOLN ophthalmic solution   11  . zolpidem (AMBIEN) 10 MG tablet as needed.  0   No current facility-administered medications for this visit.    Family History  Problem Relation Age of Onset  . Cancer Mother     ovarian  . Cancer Father     colon  . Cancer Paternal Aunt     breast    ROS:  Pertinent items are noted in HPI.  Otherwise, a comprehensive ROS was negative.  Exam:   BP 126/80 mmHg  Pulse 56  Resp 18  Ht 5' 5"  (1.651 m)  Wt 134 lb (60.782 kg)  BMI 22.30 kg/m2  LMP 09/17/1987  Weight change: +3#   Height: 5' 5"  (165.1 cm)  Ht Readings from Last 3 Encounters:  12/08/15 5' 5"  (1.651 m)  10/06/14 5' 5"  (1.651 m)  09/02/13 5' 4.75" (1.645 m)    General appearance: alert, cooperative and appears stated age Head: Normocephalic, without obvious abnormality, atraumatic Neck: no adenopathy, supple, symmetrical, trachea midline and thyroid normal to inspection and palpation Lungs: clear to auscultation bilaterally Breasts: absent right breast with well healed scar and axilla without any masses, left breast without masses or skin changes, no nipple discharge, no axillary masses Heart: regular rate and rhythm Abdomen: soft, non-tender; bowel sounds normal; no masses,  no organomegaly Extremities: extremities normal, atraumatic, no cyanosis or edema Skin: Skin color, texture, turgor normal. No rashes or lesions Lymph nodes: Cervical, supraclavicular, and axillary nodes normal. No abnormal inguinal nodes palpated Neurologic: Grossly normal   Pelvic: External genitalia:  no lesions              Urethra:   normal appearing urethra with no masses, tenderness or lesions              Bartholins and Skenes: normal                 Vagina: normal appearing vagina with normal color and discharge, no lesions              Cervix: no lesions              Pap taken: No. Bimanual Exam:  Uterus:  normal size, contour, position, consistency, mobility, non-tender              Adnexa: normal adnexa and no mass, fullness, tenderness               Rectovaginal: Confirms               Anus:  normal sphincter tone, no lesions  Chaperone was present for exam.  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 11 inches removed. Hypertension Osteopenia Glaucoma  P: Mammogram yearly.  Get release of MMG from 2016. pap smear 1/16.  No pap today. Celexa 23m daily. Rx to pharmacy. Xanax 0.284mprn #30/1RD Labs with Dr. EhMarisue Humblen February return annually or prn

## 2015-12-20 DIAGNOSIS — H4052X1 Glaucoma secondary to other eye disorders, left eye, mild stage: Secondary | ICD-10-CM | POA: Diagnosis not present

## 2016-02-02 DIAGNOSIS — D485 Neoplasm of uncertain behavior of skin: Secondary | ICD-10-CM | POA: Diagnosis not present

## 2016-02-02 DIAGNOSIS — Z85828 Personal history of other malignant neoplasm of skin: Secondary | ICD-10-CM | POA: Diagnosis not present

## 2016-02-02 DIAGNOSIS — C44311 Basal cell carcinoma of skin of nose: Secondary | ICD-10-CM | POA: Diagnosis not present

## 2016-03-13 DIAGNOSIS — C44311 Basal cell carcinoma of skin of nose: Secondary | ICD-10-CM | POA: Diagnosis not present

## 2016-03-13 DIAGNOSIS — Z85828 Personal history of other malignant neoplasm of skin: Secondary | ICD-10-CM | POA: Diagnosis not present

## 2016-04-29 DIAGNOSIS — H40051 Ocular hypertension, right eye: Secondary | ICD-10-CM | POA: Diagnosis not present

## 2016-04-29 DIAGNOSIS — H4052X1 Glaucoma secondary to other eye disorders, left eye, mild stage: Secondary | ICD-10-CM | POA: Diagnosis not present

## 2016-05-06 DIAGNOSIS — E7800 Pure hypercholesterolemia, unspecified: Secondary | ICD-10-CM | POA: Diagnosis not present

## 2016-05-06 DIAGNOSIS — I10 Essential (primary) hypertension: Secondary | ICD-10-CM | POA: Diagnosis not present

## 2016-05-06 DIAGNOSIS — H409 Unspecified glaucoma: Secondary | ICD-10-CM | POA: Diagnosis not present

## 2016-05-06 DIAGNOSIS — Z1389 Encounter for screening for other disorder: Secondary | ICD-10-CM | POA: Diagnosis not present

## 2016-06-07 DIAGNOSIS — Z23 Encounter for immunization: Secondary | ICD-10-CM | POA: Diagnosis not present

## 2016-06-17 DIAGNOSIS — H40051 Ocular hypertension, right eye: Secondary | ICD-10-CM | POA: Diagnosis not present

## 2016-06-17 DIAGNOSIS — H4052X1 Glaucoma secondary to other eye disorders, left eye, mild stage: Secondary | ICD-10-CM | POA: Diagnosis not present

## 2016-06-24 ENCOUNTER — Other Ambulatory Visit: Payer: Self-pay

## 2016-06-24 MED ORDER — CITALOPRAM HYDROBROMIDE 20 MG PO TABS: 20.0000 mg | ORAL_TABLET | Freq: Every day | ORAL | 4 refills | Status: DC

## 2016-06-24 NOTE — Telephone Encounter (Signed)
Medication refill request: Citalopram 20mg  Last AEX:  12/08/15 SM Next AEX: 03/21/17  Last MMG (if hormonal medication request): 10/02/15 BIRADS1 negative Refill authorized: 12/08/15 #30 w/13; [facility] requesting [facility] supply, please advise

## 2016-09-16 DIAGNOSIS — C50912 Malignant neoplasm of unspecified site of left female breast: Secondary | ICD-10-CM

## 2016-09-16 HISTORY — DX: Malignant neoplasm of unspecified site of left female breast: C50.912

## 2016-09-16 HISTORY — PX: BREAST BIOPSY: SHX20

## 2016-09-25 DIAGNOSIS — H40051 Ocular hypertension, right eye: Secondary | ICD-10-CM | POA: Diagnosis not present

## 2016-09-25 DIAGNOSIS — H4052X1 Glaucoma secondary to other eye disorders, left eye, mild stage: Secondary | ICD-10-CM | POA: Diagnosis not present

## 2016-10-09 DIAGNOSIS — Z1231 Encounter for screening mammogram for malignant neoplasm of breast: Secondary | ICD-10-CM | POA: Diagnosis not present

## 2016-10-09 DIAGNOSIS — N6324 Unspecified lump in the left breast, lower inner quadrant: Secondary | ICD-10-CM | POA: Diagnosis not present

## 2016-10-09 DIAGNOSIS — N6322 Unspecified lump in the left breast, upper inner quadrant: Secondary | ICD-10-CM | POA: Diagnosis not present

## 2016-10-09 DIAGNOSIS — Z853 Personal history of malignant neoplasm of breast: Secondary | ICD-10-CM | POA: Diagnosis not present

## 2016-10-10 ENCOUNTER — Other Ambulatory Visit: Payer: Self-pay | Admitting: Radiology

## 2016-10-10 DIAGNOSIS — N6322 Unspecified lump in the left breast, upper inner quadrant: Secondary | ICD-10-CM | POA: Diagnosis not present

## 2016-10-10 DIAGNOSIS — Z1231 Encounter for screening mammogram for malignant neoplasm of breast: Secondary | ICD-10-CM | POA: Diagnosis not present

## 2016-10-10 DIAGNOSIS — N6324 Unspecified lump in the left breast, lower inner quadrant: Secondary | ICD-10-CM | POA: Diagnosis not present

## 2016-10-10 DIAGNOSIS — Z170 Estrogen receptor positive status [ER+]: Secondary | ICD-10-CM | POA: Diagnosis not present

## 2016-10-10 DIAGNOSIS — C50312 Malignant neoplasm of lower-inner quadrant of left female breast: Secondary | ICD-10-CM | POA: Diagnosis not present

## 2016-10-10 DIAGNOSIS — Z853 Personal history of malignant neoplasm of breast: Secondary | ICD-10-CM | POA: Diagnosis not present

## 2016-10-10 DIAGNOSIS — C50912 Malignant neoplasm of unspecified site of left female breast: Secondary | ICD-10-CM | POA: Diagnosis not present

## 2016-10-10 DIAGNOSIS — C50812 Malignant neoplasm of overlapping sites of left female breast: Secondary | ICD-10-CM | POA: Diagnosis not present

## 2016-10-10 DIAGNOSIS — D0592 Unspecified type of carcinoma in situ of left breast: Secondary | ICD-10-CM | POA: Diagnosis not present

## 2016-10-16 ENCOUNTER — Encounter: Payer: Self-pay | Admitting: Genetic Counselor

## 2016-10-22 ENCOUNTER — Other Ambulatory Visit: Payer: Self-pay | Admitting: Surgery

## 2016-10-22 DIAGNOSIS — C50912 Malignant neoplasm of unspecified site of left female breast: Secondary | ICD-10-CM | POA: Diagnosis not present

## 2016-10-22 DIAGNOSIS — Z85038 Personal history of other malignant neoplasm of large intestine: Secondary | ICD-10-CM | POA: Diagnosis not present

## 2016-10-22 DIAGNOSIS — Z170 Estrogen receptor positive status [ER+]: Secondary | ICD-10-CM | POA: Diagnosis not present

## 2016-10-22 DIAGNOSIS — Z853 Personal history of malignant neoplasm of breast: Secondary | ICD-10-CM | POA: Diagnosis not present

## 2016-10-23 ENCOUNTER — Other Ambulatory Visit: Payer: Self-pay | Admitting: Surgery

## 2016-11-07 NOTE — Pre-Procedure Instructions (Signed)
Morgan Mullins  11/07/2016      [id number] - [place], [place] - [address] [place] [address] Phone: [phone] Fax: [phone]    Your procedure is scheduled on Mon. Feb.26  Report to [facility] Admitting at 10:30 A.M.  Call this number if you have problems the morning of surgery:  [phone]   Remember:  Do not eat food or drink liquids after midnight on Sun. Feb. 25             The day of surgery 8:30 am  drink boost.   Take these medicines the morning of surgery with A SIP OF WATER :citalopram (celexa), eye drops             Stop aspirin, advil, motrin, ibuprofen, aleve, Bc Powders, goody's vitamins/herbal medicines.   Do not wear jewelry, make-up or nail polish.  Do not wear lotions, powders, or perfumes, or deoderant.  Do not shave 48 hours prior to surgery.  Men may shave face and neck.  Do not bring valuables to the hospital.  [facility] is not responsible for any belongings or valuables.  Contacts, dentures or bridgework may not be worn into surgery.  Leave your suitcase in the car.  After surgery it may be brought to your room.  For patients admitted to the hospital, discharge time will be determined by your treatment team.  Patients discharged the day of surgery will not be allowed to drive home.    Special instructions:   [facility]- Preparing For Surgery  Before surgery, you can play an important role. Because skin is not sterile, your skin needs to be as free of germs as possible. You can reduce the number of germs on your skin by washing with CHG (chlorahexidine gluconate) Soap before surgery.  CHG is an antiseptic cleaner which kills germs and bonds with the skin to continue killing germs even after washing.  Please do not use if you have an allergy to CHG or antibacterial soaps. If your skin becomes reddened/irritated stop using the CHG.  Do not shave (including  legs and underarms) for at least 48 hours prior to first CHG shower. It is OK to shave your face.  Please follow these instructions carefully.   1. Shower the NIGHT BEFORE SURGERY and the MORNING OF SURGERY with CHG.   2. If you chose to wash your hair, wash your hair first as usual with your normal shampoo.  3. After you shampoo, rinse your hair and body thoroughly to remove the shampoo.  4. Use CHG as you would any other liquid soap. You can apply CHG directly to the skin and wash gently with a scrungie or a clean washcloth.   5. Apply the CHG Soap to your body ONLY FROM THE NECK DOWN.  Do not use on open wounds or open sores. Avoid contact with your eyes, ears, mouth and genitals (private parts). Wash genitals (private parts) with your normal soap.  6. Wash thoroughly, paying special attention to the area where your surgery will be performed.  7. Thoroughly rinse your body with warm water from the neck down.  8. DO NOT shower/wash with your normal soap after using and rinsing off the CHG Soap.  9. Pat yourself dry with a CLEAN TOWEL.   10. Wear CLEAN PAJAMAS   11. Place CLEAN SHEETS on your bed the night of your first shower and  DO NOT SLEEP WITH PETS.    Day of Surgery: Do not apply any deodorants/lotions. Please wear clean clothes to the hospital/surgery center.      Please read over the following fact sheets that you were given. Coughing and Deep Breathing

## 2016-11-08 ENCOUNTER — Encounter (HOSPITAL_COMMUNITY): Payer: Self-pay

## 2016-11-08 ENCOUNTER — Encounter (HOSPITAL_COMMUNITY)
Admission: RE | Admit: 2016-11-08 | Discharge: 2016-11-08 | Disposition: A | Discharge status: Home / Self Care | Payer: Medicare Other | Source: Ambulatory Visit | Attending: Surgery | Admitting: Surgery

## 2016-11-08 DIAGNOSIS — L821 Other seborrheic keratosis: Secondary | ICD-10-CM | POA: Diagnosis not present

## 2016-11-08 DIAGNOSIS — C50912 Malignant neoplasm of unspecified site of left female breast: Secondary | ICD-10-CM | POA: Insufficient documentation

## 2016-11-08 DIAGNOSIS — Z01818 Encounter for other preprocedural examination: Secondary | ICD-10-CM | POA: Insufficient documentation

## 2016-11-08 DIAGNOSIS — Z853 Personal history of malignant neoplasm of breast: Secondary | ICD-10-CM | POA: Diagnosis not present

## 2016-11-08 DIAGNOSIS — Z170 Estrogen receptor positive status [ER+]: Secondary | ICD-10-CM | POA: Diagnosis not present

## 2016-11-08 DIAGNOSIS — R001 Bradycardia, unspecified: Secondary | ICD-10-CM | POA: Insufficient documentation

## 2016-11-08 DIAGNOSIS — R9431 Abnormal electrocardiogram [ECG] [EKG]: Secondary | ICD-10-CM | POA: Insufficient documentation

## 2016-11-08 DIAGNOSIS — D0502 Lobular carcinoma in situ of left breast: Secondary | ICD-10-CM | POA: Diagnosis not present

## 2016-11-08 DIAGNOSIS — Z85038 Personal history of other malignant neoplasm of large intestine: Secondary | ICD-10-CM | POA: Diagnosis not present

## 2016-11-08 DIAGNOSIS — M1990 Unspecified osteoarthritis, unspecified site: Secondary | ICD-10-CM | POA: Diagnosis not present

## 2016-11-08 DIAGNOSIS — Z01810 Encounter for preprocedural cardiovascular examination: Secondary | ICD-10-CM

## 2016-11-08 DIAGNOSIS — I10 Essential (primary) hypertension: Secondary | ICD-10-CM | POA: Diagnosis not present

## 2016-11-08 HISTORY — DX: Other specified postprocedural states: R11.2

## 2016-11-08 HISTORY — DX: Other specified postprocedural states: Z98.890

## 2016-11-08 LAB — BASIC METABOLIC PANEL
ANION GAP: 9 (ref 5–15)
BUN: 10 mg/dL (ref 6–20)
CO2: 26 mmol/L (ref 22–32)
Calcium: 9.3 mg/dL (ref 8.9–10.3)
Chloride: 99 mmol/L — ABNORMAL LOW (ref 101–111)
Creatinine, Ser: 0.95 mg/dL (ref 0.44–1.00)
GFR calc Af Amer: >60 mL/min (ref >60)
GFR calc non Af Amer: 56 mL/min — ABNORMAL LOW (ref >60)
Glucose, Bld: 83 mg/dL (ref 65–99)
POTASSIUM: 3.8 mmol/L (ref 3.5–5.1)
SODIUM: 134 mmol/L — AB (ref 135–145)

## 2016-11-08 LAB — CBC
HCT: 37.1 % (ref 36.0–46.0)
HEMOGLOBIN: 12.5 g/dL (ref 12.0–15.0)
MCH: 30.4 pg (ref 26.0–34.0)
MCHC: 33.7 g/dL (ref 30.0–36.0)
MCV: 90.3 fL (ref 78.0–100.0)
Platelets: 256 10*3/uL (ref 150–400)
RBC: 4.11 MIL/uL (ref 3.87–5.11)
RDW: 12.8 % (ref 11.5–15.5)
WBC: 5.5 10*3/uL (ref 4.0–10.5)

## 2016-11-10 NOTE — H&P (Signed)
Morgan Mullins Location: [facility] Patient #: [phone] DOB: [birth year] Married / Language: English / Race: White Female  History of Present Illness   The patient is a 81 year old female who presents with breast cancer.   Her PCP is Dr. Gates Rigg.  Sees Dr. Edwinna Areola for Gyn.  She comes with her husband.  She underwent a mammogram at [facility] on 10/09/2016. She was found to have a 1.6 cm density in the left lower inner quadrant.She has architectural distortion in the upper inner quadrant. She underwent a biopsy of her left breast on 10/10/2016 of 2 areas.  The pathology ([id number]) - 8 c'clock - IDC, ER - 95%, PR - 30%, Ki67 - 15% and 12 o'clock - IDC, ER - 99%, PR - 95%, Ki67 - 5%.  I discussed the options for breast cancer treatment with the patient. I discussed a multidisciplinary approach to the treatment of breast cancer, which includes medical oncology and radiation oncology. I discussed the surgical options of lumpectomy vs. mastectomy. If mastectomy, there is the possibility of reconstruction. I discussed the options of lymph node biopsy. The treatment plan depends on the pathologic staging of the tumor and the patient's personal wishes. The risks of surgery include, but are not limited to, bleeding, infection, the need for further surgery, and nerve injury. The patient has been given literature on the treatment of breast cancer. She is ready to get on with surgery and do a left mastectomy without reconstruction.  Plan: 1) Right simple mastectomy with sentinel lymph node  History of breast cancer: Stage 1 carcinoma of right breast Treated with mastectomy in 1992 by Dr. Chauncey Cruel. Blievernicht. She does not remember seeing medical or radiation oncology. Disease free on the right side.  Past medical history: 1. Stage 1 carcinoma of right colon Treated with right colectomy 2001. Stricture at about 30 cm  when I did her colonoscopy in 07/2003. Disease free. Colonoscopy last year in [facility]. No definitd time for repeat colonoscopy. Plan one year follow-up.  [A letter from Dr. Donnie Mesa to the patient (she made me a copy) said that they usually stop doing colonoscopies at age 45. The other option would be follow up colonoscopy in 5 years, when she is 39. In his summary, he did not think that she is high risk for further GI cancer. DN 07/05/2013]  2. Diverticulitis. Sigmoid colon. Attack last November, 2011. Has done well since then.  3. BRCA 1/2 negative - March 2013.  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 She saw Dr. Governor Specking in [facility].   Social History: Married No children Sister lives in [place]. She had a sister who had a knee replacement by Dr. Alvan Dame 10/21/2016.  Her father had colon ca, her mother had ovarian ca   Allergies Malachy Moan, [place]; 10/22/2016 3:19 PM) No Known Drug Allergies 10/13/2014  Medication History Malachy Moan, [place]; 10/22/2016 3:19 PM) Dorzolamide HCl-Timolol Mal (22.3-6.8MG/ML Solution, Ophthalmic) Active. Xanax (0.25MG Tablet, Oral) Active. Aspirin Childrens (81MG Tablet Chewable, Oral) Active. Calcium-Vitamin D (500MG Capsule, Oral) Active. Cholecalciferol Active. CeleXA (20MG Tablet, Oral) Active. Cosopt PF (22.3-6.8MG/ML Solution, Ophthalmic) Active. Zestoretic (20-25MG Tablet, Oral) Active. Multivitamin Adults 50+ (Oral) Active. Zocor (40MG Tablet, Oral) Active. Travatan Z (0.004% Solution, Ophthalmic) Active. Ambien (10MG Tablet, Oral) Active. Medications Reconciled  Vitals Malachy Moan RMA; 10/22/2016 3:19 PM) 10/22/2016 3:19 PM Weight: 137 lb Height: 65in Body Surface Area: 1.68 m Body Mass Index: 22.8 kg/m  Temp.: 98.48F  Pulse: 54 (Regular)  BP: 152/80 (Sitting, Left Arm,  Standard)   Physical Exam  General: Thin older WF alert and generally healthy appearing. HEENT: Normal. Pupils equal. Good dentition.  Neck: Supple. No mass. No thyroid mass.  Lymph Nodes: No supraclavicular or cervical nodes.  Breasts: Right - absent. Has 2 cm unpigmented seborrheic skin lesion  Left - No mass. 2 cm pigmented seborrheic skin lesion. She has a mass at 8 o'clock. How much of this is hematoma and how much tumor is unclear.  Lungs: Clear to auscultation and symmetric breath sounds. Heart: RRR. No murmur or rub.  Abdomen: Soft. No mass. No tenderness. No hernia. Normal bowel sounds. No abdominal scars. Rectal: No mass. Guaiac negative  Extremities: Good strength and ROM in upper and lower extremities.  Neurologic: Grossly intact to motor and sensory function. Psychiatric: Has normal mood and affect. Behavior is normal.   Assessment & Plan  1.  MALIGNANT NEOPLASM OF LEFT BREAST, STAGE 1, ESTROGEN RECEPTOR POSITIVE (C50.912)  Story: Biopsy of her left breast on 10/10/2016 of 2 areas.  The pathology ([id number]) - 8 c'clock - IDC, ER - 95%, PR - 30%, Ki67 - 15% and 12 o'clock - IDC, ER - 99%, PR - 95%, Ki67 - 5%.  Plan   1) Schedule Left mastectomy with left axillary sentinel lymph node.  2.  HISTORY OF RIGHT BREAST CANCER (Z85.3)  Story: Treated with right mastectomy in 1992 by Dr. Chauncey Cruel. Blievernicht.  Impression: Disease free 3. BRCA 1/2 negative - March 2013.  4.  HISTORY OF COLON CANCER, STAGE I (H61.607)  Story: Stage 1 carcinoma of right colon  Treated with right colectomy 2001.  Stricture at about 30 cm when I did her colonoscopy in 07/2003.  Impression: Diseae free.   Alphonsa Overall, MD, [facility] Surgery Pager: [phone] Office phone:  [phone]

## 2016-11-11 ENCOUNTER — Ambulatory Visit (HOSPITAL_COMMUNITY): Payer: Medicare Other | Admitting: Anesthesiology

## 2016-11-11 ENCOUNTER — Encounter (HOSPITAL_COMMUNITY)
Admission: RE | Admit: 2016-11-11 | Discharge: 2016-11-11 | Disposition: A | Discharge status: Home / Self Care | Payer: Medicare Other | Source: Ambulatory Visit | Attending: Surgery | Admitting: Surgery

## 2016-11-11 ENCOUNTER — Ambulatory Visit (HOSPITAL_COMMUNITY): Payer: Medicare Other | Admitting: Vascular Surgery

## 2016-11-11 ENCOUNTER — Encounter (HOSPITAL_COMMUNITY): Payer: Self-pay | Admitting: *Deleted

## 2016-11-11 ENCOUNTER — Encounter (HOSPITAL_COMMUNITY)
Admission: RE | Disposition: A | Discharge status: Home / Self Care | Payer: Self-pay | Source: Ambulatory Visit | Attending: Surgery

## 2016-11-11 ENCOUNTER — Observation Stay (HOSPITAL_COMMUNITY)
Admission: RE | Admit: 2016-11-11 | Discharge: 2016-11-12 | Disposition: A | Discharge status: Home / Self Care | Payer: Medicare Other | Source: Ambulatory Visit | Attending: Surgery | Admitting: Surgery

## 2016-11-11 DIAGNOSIS — Z170 Estrogen receptor positive status [ER+]: Secondary | ICD-10-CM | POA: Diagnosis not present

## 2016-11-11 DIAGNOSIS — I10 Essential (primary) hypertension: Secondary | ICD-10-CM | POA: Diagnosis not present

## 2016-11-11 DIAGNOSIS — M1990 Unspecified osteoarthritis, unspecified site: Secondary | ICD-10-CM | POA: Diagnosis not present

## 2016-11-11 DIAGNOSIS — L821 Other seborrheic keratosis: Secondary | ICD-10-CM | POA: Diagnosis not present

## 2016-11-11 DIAGNOSIS — C50912 Malignant neoplasm of unspecified site of left female breast: Secondary | ICD-10-CM

## 2016-11-11 DIAGNOSIS — Z85038 Personal history of other malignant neoplasm of large intestine: Secondary | ICD-10-CM | POA: Diagnosis not present

## 2016-11-11 DIAGNOSIS — D0502 Lobular carcinoma in situ of left breast: Principal | ICD-10-CM | POA: Insufficient documentation

## 2016-11-11 DIAGNOSIS — Z853 Personal history of malignant neoplasm of breast: Secondary | ICD-10-CM | POA: Insufficient documentation

## 2016-11-11 DIAGNOSIS — L98499 Non-pressure chronic ulcer of skin of other sites with unspecified severity: Secondary | ICD-10-CM | POA: Diagnosis not present

## 2016-11-11 DIAGNOSIS — G8918 Other acute postprocedural pain: Secondary | ICD-10-CM | POA: Diagnosis not present

## 2016-11-11 HISTORY — DX: Malignant neoplasm of colon, unspecified: C18.9

## 2016-11-11 HISTORY — PX: MASTECTOMY COMPLETE / SIMPLE W/ SENTINEL NODE BIOPSY: SUR846

## 2016-11-11 HISTORY — PX: MASTECTOMY W/ SENTINEL NODE BIOPSY: SHX2001

## 2016-11-11 HISTORY — DX: Malignant neoplasm of unspecified site of right female breast: C50.911

## 2016-11-11 HISTORY — DX: Malignant neoplasm of unspecified site of left female breast: C50.912

## 2016-11-11 SURGERY — MASTECTOMY WITH SENTINEL LYMPH NODE BIOPSY
Anesthesia: General | Site: Breast | Laterality: Left

## 2016-11-11 MED ORDER — MORPHINE SULFATE (PF) 2 MG/ML IV SOLN: 1.0000 mg | INTRAVENOUS | Status: DC | PRN

## 2016-11-11 MED ORDER — HYDROMORPHONE HCL 1 MG/ML IJ SOLN
0.2500 mg | INTRAMUSCULAR | Status: DC | PRN
  Administered 2016-11-11 (×3): 0.5 mg via INTRAVENOUS

## 2016-11-11 MED ORDER — IBUPROFEN 600 MG PO TABS: 600.0000 mg | ORAL_TABLET | Freq: Four times a day (QID) | ORAL | Status: DC | PRN

## 2016-11-11 MED ORDER — ONDANSETRON HCL 4 MG/2ML IJ SOLN: 4.0000 mg | Freq: Four times a day (QID) | INTRAMUSCULAR | Status: DC | PRN

## 2016-11-11 MED ORDER — CHLORHEXIDINE GLUCONATE CLOTH 2 % EX PADS: 6.0000 | MEDICATED_PAD | Freq: Once | CUTANEOUS | Status: DC

## 2016-11-11 MED ORDER — LISINOPRIL 20 MG PO TABS
20.0000 mg | ORAL_TABLET | Freq: Every day | ORAL | Status: DC
  Administered 2016-11-11: 20 mg via ORAL
  Filled 2016-11-11: qty 1

## 2016-11-11 MED ORDER — LISINOPRIL-HYDROCHLOROTHIAZIDE 20-12.5 MG PO TABS: 1.0000 | ORAL_TABLET | Freq: Every day | ORAL | Status: DC

## 2016-11-11 MED ORDER — METHYLENE BLUE 0.5 % INJ SOLN
INTRAVENOUS | Status: AC
  Filled 2016-11-11: qty 10

## 2016-11-11 MED ORDER — FENTANYL CITRATE (PF) 100 MCG/2ML IJ SOLN
INTRAMUSCULAR | Status: AC
  Filled 2016-11-11: qty 4

## 2016-11-11 MED ORDER — TIMOLOL MALEATE 0.5 % OP SOLN
1.0000 [drp] | Freq: Two times a day (BID) | OPHTHALMIC | Status: DC
  Administered 2016-11-11: 1 [drp] via OPHTHALMIC
  Filled 2016-11-11: qty 5

## 2016-11-11 MED ORDER — GLYCOPYRROLATE 0.2 MG/ML IJ SOLN
INTRAMUSCULAR | Status: DC | PRN
  Administered 2016-11-11: 0.2 mg via INTRAVENOUS

## 2016-11-11 MED ORDER — LACTATED RINGERS IV SOLN
INTRAVENOUS | Status: DC | PRN
  Administered 2016-11-11 (×2): via INTRAVENOUS

## 2016-11-11 MED ORDER — HYDROMORPHONE HCL 1 MG/ML IJ SOLN
INTRAMUSCULAR | Status: AC
  Filled 2016-11-11: qty 0.5

## 2016-11-11 MED ORDER — LACTATED RINGERS IV SOLN
INTRAVENOUS | Status: DC
  Administered 2016-11-11: 11:00:00 via INTRAVENOUS

## 2016-11-11 MED ORDER — LIDOCAINE 2% (20 MG/ML) 5 ML SYRINGE
INTRAMUSCULAR | Status: AC
  Filled 2016-11-11: qty 5

## 2016-11-11 MED ORDER — HYDROCHLOROTHIAZIDE 12.5 MG PO CAPS
12.5000 mg | ORAL_CAPSULE | Freq: Every day | ORAL | Status: DC
  Administered 2016-11-11: 12.5 mg via ORAL
  Filled 2016-11-11: qty 1

## 2016-11-11 MED ORDER — HYDROMORPHONE HCL 1 MG/ML IJ SOLN
INTRAMUSCULAR | Status: AC
Start: 2016-11-11 — End: 2016-11-12
  Filled 2016-11-11: qty 0.5

## 2016-11-11 MED ORDER — ONDANSETRON HCL 4 MG/2ML IJ SOLN
INTRAMUSCULAR | Status: AC
  Filled 2016-11-11: qty 2

## 2016-11-11 MED ORDER — TECHNETIUM TC 99M SULFUR COLLOID FILTERED
1.0000 | Freq: Once | INTRAVENOUS | Status: AC | PRN
  Administered 2016-11-11: 1 via INTRADERMAL

## 2016-11-11 MED ORDER — PROPOFOL 10 MG/ML IV BOLUS
INTRAVENOUS | Status: AC
  Filled 2016-11-11: qty 20

## 2016-11-11 MED ORDER — ONDANSETRON HCL 4 MG/2ML IJ SOLN
INTRAMUSCULAR | Status: DC | PRN
  Administered 2016-11-11: 4 mg via INTRAVENOUS

## 2016-11-11 MED ORDER — MIDAZOLAM HCL 2 MG/2ML IJ SOLN
INTRAMUSCULAR | Status: AC
  Filled 2016-11-11: qty 2

## 2016-11-11 MED ORDER — SODIUM CHLORIDE 0.9 % IJ SOLN: INTRAVENOUS | Status: DC | PRN

## 2016-11-11 MED ORDER — DEXTROSE 5 % IV SOLN
INTRAVENOUS | Status: DC | PRN
  Administered 2016-11-11: 13:00:00 via INTRAVENOUS

## 2016-11-11 MED ORDER — CEFAZOLIN SODIUM-DEXTROSE 2-4 GM/100ML-% IV SOLN
2.0000 g | INTRAVENOUS | Status: AC
  Administered 2016-11-11: 2 g via INTRAVENOUS
  Filled 2016-11-11: qty 100

## 2016-11-11 MED ORDER — PROMETHAZINE HCL 25 MG/ML IJ SOLN: 6.2500 mg | INTRAMUSCULAR | Status: DC | PRN

## 2016-11-11 MED ORDER — ZOLPIDEM TARTRATE 5 MG PO TABS: 10.0000 mg | ORAL_TABLET | Freq: Every evening | ORAL | Status: DC | PRN

## 2016-11-11 MED ORDER — FENTANYL CITRATE (PF) 100 MCG/2ML IJ SOLN
INTRAMUSCULAR | Status: DC | PRN
  Administered 2016-11-11 (×2): 50 ug via INTRAVENOUS

## 2016-11-11 MED ORDER — DORZOLAMIDE HCL 2 % OP SOLN
1.0000 [drp] | Freq: Two times a day (BID) | OPHTHALMIC | Status: DC
  Administered 2016-11-11: 1 [drp] via OPHTHALMIC
  Filled 2016-11-11: qty 10

## 2016-11-11 MED ORDER — PROPOFOL 10 MG/ML IV BOLUS
INTRAVENOUS | Status: DC | PRN
Start: 2016-11-11 — End: 2016-11-11
  Administered 2016-11-11: 150 mg via INTRAVENOUS

## 2016-11-11 MED ORDER — ACETAMINOPHEN 500 MG PO TABS
1000.0000 mg | ORAL_TABLET | ORAL | Status: AC
  Administered 2016-11-11: 1000 mg via ORAL
  Filled 2016-11-11: qty 2

## 2016-11-11 MED ORDER — DORZOLAMIDE HCL-TIMOLOL MAL 2-0.5 % OP SOLN
1.0000 [drp] | Freq: Two times a day (BID) | OPHTHALMIC | Status: DC
  Filled 2016-11-11: qty 10

## 2016-11-11 MED ORDER — DEXAMETHASONE SODIUM PHOSPHATE 10 MG/ML IJ SOLN
INTRAMUSCULAR | Status: AC
  Filled 2016-11-11: qty 1

## 2016-11-11 MED ORDER — ALPRAZOLAM 0.25 MG PO TABS
0.2500 mg | ORAL_TABLET | Freq: Every evening | ORAL | Status: DC | PRN
  Filled 2016-11-11: qty 1

## 2016-11-11 MED ORDER — 0.9 % SODIUM CHLORIDE (POUR BTL) OPTIME
TOPICAL | Status: DC | PRN
  Administered 2016-11-11: 1000 mL

## 2016-11-11 MED ORDER — FENTANYL CITRATE (PF) 100 MCG/2ML IJ SOLN
INTRAMUSCULAR | Status: AC
  Administered 2016-11-11: 50 ug
  Filled 2016-11-11: qty 2

## 2016-11-11 MED ORDER — MIDAZOLAM HCL 5 MG/5ML IJ SOLN
INTRAMUSCULAR | Status: DC | PRN
  Administered 2016-11-11: 2 mg via INTRAVENOUS

## 2016-11-11 MED ORDER — ONDANSETRON 4 MG PO TBDP: 4.0000 mg | ORAL_TABLET | Freq: Four times a day (QID) | ORAL | Status: DC | PRN

## 2016-11-11 MED ORDER — HYDROCODONE-ACETAMINOPHEN 5-325 MG PO TABS: 1.0000 | ORAL_TABLET | ORAL | Status: DC | PRN

## 2016-11-11 MED ORDER — BACITRACIN ZINC 500 UNIT/GM EX OINT
TOPICAL_OINTMENT | CUTANEOUS | Status: DC | PRN
  Administered 2016-11-11: 1 via TOPICAL

## 2016-11-11 MED ORDER — EPHEDRINE SULFATE 50 MG/ML IJ SOLN
INTRAMUSCULAR | Status: DC | PRN
  Administered 2016-11-11: 10 mg via INTRAVENOUS

## 2016-11-11 MED ORDER — BACITRACIN ZINC 500 UNIT/GM EX OINT
TOPICAL_OINTMENT | CUTANEOUS | Status: AC
  Filled 2016-11-11: qty 28.35

## 2016-11-11 MED ORDER — DEXAMETHASONE SODIUM PHOSPHATE 10 MG/ML IJ SOLN
INTRAMUSCULAR | Status: DC | PRN
  Administered 2016-11-11: 10 mg via INTRAVENOUS

## 2016-11-11 MED ORDER — GABAPENTIN 300 MG PO CAPS
300.0000 mg | ORAL_CAPSULE | ORAL | Status: AC
  Administered 2016-11-11: 300 mg via ORAL
  Filled 2016-11-11: qty 1

## 2016-11-11 MED ORDER — HEPARIN SODIUM (PORCINE) 5000 UNIT/ML IJ SOLN
5000.0000 [IU] | Freq: Three times a day (TID) | INTRAMUSCULAR | Status: DC
  Administered 2016-11-12: 5000 [IU] via SUBCUTANEOUS
  Filled 2016-11-11: qty 1

## 2016-11-11 MED ORDER — KCL IN DEXTROSE-NACL 20-5-0.45 MEQ/L-%-% IV SOLN
INTRAVENOUS | Status: DC
  Administered 2016-11-11: 20:00:00 via INTRAVENOUS
  Filled 2016-11-11: qty 1000

## 2016-11-11 MED ORDER — BUPIVACAINE-EPINEPHRINE (PF) 0.5% -1:200000 IJ SOLN
INTRAMUSCULAR | Status: DC | PRN
  Administered 2016-11-11: 30 mL

## 2016-11-11 SURGICAL SUPPLY — 63 items
ADH SKN CLS APL DERMABOND .7 (GAUZE/BANDAGES/DRESSINGS) ×1
APPLIER CLIP 9.375 MED OPEN (MISCELLANEOUS) ×3
APR CLP MED 9.3 20 MLT OPN (MISCELLANEOUS) ×1
ATCH SMKEVC FLXB CAUT HNDSWH (FILTER) ×1 IMPLANT
BINDER BREAST LRG (GAUZE/BANDAGES/DRESSINGS) ×2 IMPLANT
BINDER BREAST XLRG (GAUZE/BANDAGES/DRESSINGS) IMPLANT
BIOPATCH RED 1 DISK 7.0 (GAUZE/BANDAGES/DRESSINGS) ×2 IMPLANT
BIOPATCH RED 1IN DISK 7.0MM (GAUZE/BANDAGES/DRESSINGS) ×2
BLADE 10 SAFETY STRL DISP (BLADE) ×2 IMPLANT
CANISTER SUCT 3000ML PPV (MISCELLANEOUS) ×6 IMPLANT
CHLORAPREP W/TINT 26ML (MISCELLANEOUS) ×5 IMPLANT
CLIP APPLIE 9.375 MED OPEN (MISCELLANEOUS) ×1 IMPLANT
CONT SPEC 4OZ CLIKSEAL STRL BL (MISCELLANEOUS) ×3 IMPLANT
COVER PROBE W GEL 5X96 (DRAPES) ×3 IMPLANT
COVER SURGICAL LIGHT HANDLE (MISCELLANEOUS) ×3 IMPLANT
DERMABOND ADVANCED (GAUZE/BANDAGES/DRESSINGS) ×2
DERMABOND ADVANCED .7 DNX12 (GAUZE/BANDAGES/DRESSINGS) ×1 IMPLANT
DEVICE DISSECT PLASMABLAD 3.0S (MISCELLANEOUS) IMPLANT
DRAIN CHANNEL 19F RND (DRAIN) ×5 IMPLANT
DRAPE CHEST BREAST 15X10 FENES (DRAPES) ×3 IMPLANT
DRAPE PROXIMA HALF (DRAPES) ×3 IMPLANT
DRSG PAD ABDOMINAL 8X10 ST (GAUZE/BANDAGES/DRESSINGS) ×2 IMPLANT
ELECT CAUTERY BLADE 6.4 (BLADE) ×3 IMPLANT
ELECT REM PT RETURN 9FT ADLT (ELECTROSURGICAL) ×6
ELECTRODE REM PT RTRN 9FT ADLT (ELECTROSURGICAL) ×2 IMPLANT
EVACUATOR SILICONE 100CC (DRAIN) ×5 IMPLANT
EVACUATOR SMOKE ACCUVAC VALLEY (FILTER) ×2
GAUZE SPONGE 4X4 12PLY STRL (GAUZE/BANDAGES/DRESSINGS) ×3 IMPLANT
GLOVE SURG SIGNA 7.5 PF LTX (GLOVE) ×3 IMPLANT
GLOVE SURG SS PI 7.5 STRL IVOR (GLOVE) ×2 IMPLANT
GOWN STRL REUS W/ TWL LRG LVL3 (GOWN DISPOSABLE) ×1 IMPLANT
GOWN STRL REUS W/ TWL XL LVL3 (GOWN DISPOSABLE) ×1 IMPLANT
GOWN STRL REUS W/TWL LRG LVL3 (GOWN DISPOSABLE) ×3
GOWN STRL REUS W/TWL XL LVL3 (GOWN DISPOSABLE) ×3
ILLUMINATOR WAVEGUIDE N/F (MISCELLANEOUS) IMPLANT
KIT BASIN OR (CUSTOM PROCEDURE TRAY) ×3 IMPLANT
KIT ROOM TURNOVER OR (KITS) ×3 IMPLANT
LIGHT WAVEGUIDE WIDE FLAT (MISCELLANEOUS) IMPLANT
MARKER SKIN DUAL TIP RULER LAB (MISCELLANEOUS) ×3 IMPLANT
NDL 18GX1X1/2 (RX/OR ONLY) (NEEDLE) IMPLANT
NDL FILTER BLUNT 18X1 1/2 (NEEDLE) IMPLANT
NDL HYPO 25GX1X1/2 BEV (NEEDLE) IMPLANT
NEEDLE 18GX1X1/2 (RX/OR ONLY) (NEEDLE) IMPLANT
NEEDLE FILTER BLUNT 18X 1/2SAF (NEEDLE)
NEEDLE FILTER BLUNT 18X1 1/2 (NEEDLE) IMPLANT
NEEDLE HYPO 25GX1X1/2 BEV (NEEDLE) IMPLANT
NS IRRIG 1000ML POUR BTL (IV SOLUTION) ×3 IMPLANT
PACK GENERAL/GYN (CUSTOM PROCEDURE TRAY) ×3 IMPLANT
PAD ARMBOARD 7.5X6 YLW CONV (MISCELLANEOUS) ×3 IMPLANT
PLASMABLADE 3.0S (MISCELLANEOUS)
SPECIMEN JAR X LARGE (MISCELLANEOUS) ×3 IMPLANT
SPONGE GAUZE 4X4 12PLY STER LF (GAUZE/BANDAGES/DRESSINGS) ×2 IMPLANT
SPONGE LAP 18X18 X RAY DECT (DISPOSABLE) ×2 IMPLANT
STAPLER VISISTAT 35W (STAPLE) IMPLANT
SUT ETHILON 2 0 FS 18 (SUTURE) ×5 IMPLANT
SUT MNCRL AB 4-0 PS2 18 (SUTURE) ×5 IMPLANT
SUT SILK 2 0 FS (SUTURE) ×3 IMPLANT
SUT VIC AB 3-0 SH 18 (SUTURE) ×3 IMPLANT
SYR CONTROL 10ML LL (SYRINGE) IMPLANT
TOWEL OR 17X24 6PK STRL BLUE (TOWEL DISPOSABLE) ×3 IMPLANT
TOWEL OR 17X26 10 PK STRL BLUE (TOWEL DISPOSABLE) ×3 IMPLANT
TUBE CONNECTING 12'X1/4 (SUCTIONS) ×1
TUBE CONNECTING 12X1/4 (SUCTIONS) ×2 IMPLANT

## 2016-11-11 NOTE — Anesthesia Preprocedure Evaluation (Addendum)
Anesthesia Evaluation  Patient identified by MRN, date of birth, ID band Patient awake    Reviewed: Allergy & Precautions, NPO status , Patient's Chart, lab work & pertinent test results  Airway Mallampati: II  TM Distance: >3 FB Neck ROM: Full    Dental  (+) Dental Advisory Given   Pulmonary neg pulmonary ROS,    breath sounds clear to auscultation       Cardiovascular hypertension, Pt. on medications  Rhythm:Regular Rate:Normal     Neuro/Psych negative neurological ROS     GI/Hepatic negative GI ROS, Neg liver ROS,   Endo/Other  negative endocrine ROS  Renal/GU negative Renal ROS     Musculoskeletal  (+) Arthritis ,   Abdominal   Peds  Hematology negative hematology ROS (+)   Anesthesia Other Findings   Reproductive/Obstetrics                            Lab Results  Component Value Date   WBC 5.5 11/08/2016   HGB 12.5 11/08/2016   HCT 37.1 11/08/2016   MCV 90.3 11/08/2016   PLT 256 11/08/2016   Lab Results  Component Value Date   CREATININE 0.95 11/08/2016   BUN 10 11/08/2016   NA 134 (L) 11/08/2016   K 3.8 11/08/2016   CL 99 (L) 11/08/2016   CO2 26 11/08/2016    Anesthesia Physical Anesthesia Plan  ASA: II  Anesthesia Plan: General   Post-op Pain Management:  Regional for Post-op pain   Induction: Intravenous  Airway Management Planned: LMA  Additional Equipment:   Intra-op Plan:   Post-operative Plan: Extubation in OR  Informed Consent: I have reviewed the patients History and Physical, chart, labs and discussed the procedure including the risks, benefits and alternatives for the proposed anesthesia with the patient or authorized representative who has indicated his/her understanding and acceptance.   Dental advisory given  Plan Discussed with: CRNA  Anesthesia Plan Comments:         Anesthesia Quick Evaluation

## 2016-11-11 NOTE — Anesthesia Procedure Notes (Addendum)
Anesthesia Regional Block: Pectoralis block   Pre-Anesthetic Checklist: ,, timeout performed, Correct Patient, Correct Site, Correct Laterality, Correct Procedure, Correct Position, site marked, Risks and benefits discussed,  Surgical consent,  Pre-op evaluation,  At surgeon's request and post-op pain management  Laterality: Left  Prep: chloraprep       Needles:  Injection technique: Single-shot  Needle Type: Echogenic Needle     Needle Length: 9cm  Needle Gauge: 21     Additional Needles:   Procedures: ultrasound guided,,,,,,,,  Narrative:  Start time: 11/11/2016 11:53 AM End time: 11/11/2016 12:00 PM Injection made incrementally with aspirations every 5 mL.  Performed by: Personally  Anesthesiologist: Suzette Battiest

## 2016-11-11 NOTE — Transfer of Care (Signed)
Immediate Anesthesia Transfer of Care Note  Patient: Morgan Mullins  Procedure(s) Performed: Procedure(s): MASTECTOMY WITH AXILLARY SENTINEL LYMPH NODE BIOPSY (Left)  Patient Location: PACU  Anesthesia Type:GA combined with regional for post-op pain  Level of Consciousness: sedated  Airway & Oxygen Therapy: Patient Spontanous Breathing and Patient connected to nasal cannula oxygen  Post-op Assessment: Report given to RN and Post -op Vital signs reviewed and stable  Post vital signs: Reviewed and stable  Last Vitals:  Vitals:   11/11/16 1210 11/11/16 1504  BP: (!) 176/69 140/78  Pulse: (!) 45 91  Resp: (!) 21   Temp:  36.2 C    Last Pain:  Vitals:   11/11/16 1042  TempSrc: Oral      Patients Stated Pain Goal: 0 ([id number])  Complications: No apparent anesthesia complications

## 2016-11-11 NOTE — Interval H&P Note (Signed)
History and Physical Interval Note:  11/11/2016 12:30 PM  Morgan Mullins  has presented today for surgery, with the diagnosis of LEFT BREAST CANCER  The various methods of treatment have been discussed with the patient and family.  Patients husband is in the room.  After consideration of risks, benefits and other options for treatment, the patient has consented to  Procedure(s): MASTECTOMY WITH AXILLARY SENTINEL LYMPH NODE BIOPSY (Left) as a surgical intervention .  The patient's history has been reviewed, patient examined, no change in status, stable for surgery.  I have reviewed the patient's chart and labs.  Questions were answered to the patient's satisfaction.     Dartanian Knaggs H

## 2016-11-11 NOTE — Op Note (Signed)
11/11/2016  3:09 PM  PATIENT:  Morgan Mullins, 81 y.o., female, MRN: [id number]  PREOP DIAGNOSIS:  LEFT BREAST CANCER  POSTOP DIAGNOSIS:   Left breast cancer x 2, 8 and 11 o'clock position (T1, N0)  PROCEDURE:   Procedure(s):  MASTECTOMY WITH AXILLARY SENTINEL LYMPH NODE BIOPSY, deep sentinel lymph node biopsy, Excision lesion left chest skin (this was excised with superior skin flap), and excision of lesion right chest skin  SURGEON:   Alphonsa Overall, M.D.  ASSISTANT:   Mila Merry, PA student  ANESTHESIA:   general  Anesthesiologist: Suzette Battiest, MD CRNA: Fidela Juneau, CRNA  General  ASA:  2  EBL:  minimal  ml  BLOOD ADMINISTERED: none  DRAINS: 2 #18 French Blake drains  LOCAL MEDICATIONS USED:   Left pectoral block  SPECIMEN:   Left mastectomy (suture lateral), left axillary sentinel lymph node (counts 600, background 20), superior mastectomy skin (short suture caudal and long suture lateral), skin lesion left chest (this skin was removed with superior mastectomy skin), and skin lesion right chest  COUNTS CORRECT:  YES  INDICATIONS FOR PROCEDURE:  Morgan Mullins is a 81 y.o. (DOB: 02/14/1936) white female whose primary care physician is Simona Huh, MD and comes for left mastectomy.   She had a remote right mastectomy in 1992 by Dr. Janan Halter.  She has been disease free since then.  She was recently found to have a 1.6 cm density in the lower inner quadrant of the left breast and distortion of the left breast architecture in the upper inner quadrant.  Biopsies showed an IDC in both areas.  We talked about the options for treatment, but she has decided to proceed with a left mastectomy without reconstruction.    The indications and risks of the surgery were explained to the patient.  The risks include, but are not limited to, infection, bleeding, and nerve injury.  OPERATIVE NOTE;  The patient was taken to room # 10 at [facility] where she underwent a general  anesthesia  supervised by Anesthesiologist: Suzette Battiest, MD CRNA: Fidela Juneau, CRNA. Her left breast and axilla were prepped with ChloraPrep and sterilely draped.    A time-out and the surgical check list was reviewed.    I made an elliptical incision including the areola in the left breast.  I developed skin flaps medially to the lateral edge of the sternum, inferiorly to the investing fascia of the rectus abdominus muscle, laterally to the anterior edge of the latissimus dorsi muscle, and superiorly to about 2 finger breaths below the clavicle.  The breast was reflected off the pectoralis muscle from medial to lateral.  The lateral attachments in the left axilla were divided and the breast removed.  A long suture was placed on the lateral aspect of the breast.   I dissected into the left axilla and found a deep sentinel lymph node.  The node had counts of 600 with a background count of 30.  This was sent as a separate specimen.     I did take a skin lesion off the upper inner left chest and upper inner right chest.  Both of these lesions looked like seborrheic keratosis.  Both lesions were sent to pathology.  The upper inner left breast lesion skin was excised when I did a wider excision of the superior left mastectomy skin.  A short suture was placed on the caudal margin and a long suture placed on the lateral margin.   I brought out  2 19 F Blake drains below the inferior flaps.  These were sewn in place with 2-0 Nylons.  I irrigated the wound with 2,000 cc of fluid.   The skin was closed with interrupted 3-0 Vicryl sutures and the skin was closed with a 4-0 Monocryl.  The wound was painted with Dermabond.    A pressure dressing was placed on the wound and the chest wrapped with a breast binder.  Her needle and sponge count were correct at the end of the case.   She was transferred to the recovery room in good condition.  Alphonsa Overall, MD, [facility] Surgery Pager:  [phone] Office phone:  [phone]

## 2016-11-11 NOTE — Anesthesia Procedure Notes (Signed)
Procedure Name: LMA Insertion Date/Time: 11/11/2016 12:56 PM Performed by: Jacquiline Doe A Pre-anesthesia Checklist: Patient identified, Emergency Drugs available, Suction available and Patient being monitored Patient Re-evaluated:Patient Re-evaluated prior to inductionOxygen Delivery Method: Circle System Utilized and Circle system utilized Preoxygenation: Pre-oxygenation with 100% oxygen Intubation Type: IV induction Ventilation: Mask ventilation without difficulty LMA: LMA inserted LMA Size: 4.0 Tube type: Oral Number of attempts: 1 Placement Confirmation: positive ETCO2 Tube secured with: Tape Dental Injury: Teeth and Oropharynx as per pre-operative assessment

## 2016-11-12 ENCOUNTER — Encounter (HOSPITAL_COMMUNITY): Payer: Self-pay | Admitting: Surgery

## 2016-11-12 ENCOUNTER — Telehealth: Payer: Self-pay | Admitting: Oncology

## 2016-11-12 DIAGNOSIS — I10 Essential (primary) hypertension: Secondary | ICD-10-CM | POA: Diagnosis not present

## 2016-11-12 DIAGNOSIS — Z853 Personal history of malignant neoplasm of breast: Secondary | ICD-10-CM | POA: Diagnosis not present

## 2016-11-12 DIAGNOSIS — Z85038 Personal history of other malignant neoplasm of large intestine: Secondary | ICD-10-CM | POA: Diagnosis not present

## 2016-11-12 DIAGNOSIS — D0502 Lobular carcinoma in situ of left breast: Secondary | ICD-10-CM | POA: Diagnosis not present

## 2016-11-12 DIAGNOSIS — L821 Other seborrheic keratosis: Secondary | ICD-10-CM | POA: Diagnosis not present

## 2016-11-12 DIAGNOSIS — Z170 Estrogen receptor positive status [ER+]: Secondary | ICD-10-CM | POA: Diagnosis not present

## 2016-11-12 MED ORDER — HYDROCODONE-ACETAMINOPHEN 5-325 MG PO TABS: 1.0000 | ORAL_TABLET | Freq: Four times a day (QID) | ORAL | 0 refills | Status: DC | PRN

## 2016-11-12 NOTE — Telephone Encounter (Signed)
Appt has been scheduled for the pt to see Dr. Jana Hakim on  3/13 at 430pm. Pt aware to arrive 30 minutes early. Demographics verified.

## 2016-11-12 NOTE — Progress Notes (Signed)
Discharge home. Home discharge instruction given to patient. JP teaching given, patient verbalizes how to care for drains. Patient very appreciative with the care given by staff.

## 2016-11-12 NOTE — Anesthesia Postprocedure Evaluation (Signed)
Anesthesia Post Note  Patient: Morgan Mullins  Procedure(s) Performed: Procedure(s) (LRB): MASTECTOMY WITH AXILLARY SENTINEL LYMPH NODE BIOPSY (Left)  Patient location during evaluation: PACU Anesthesia Type: General Level of consciousness: awake and alert Pain management: pain level controlled Vital Signs Assessment: post-procedure vital signs reviewed and stable Respiratory status: spontaneous breathing, nonlabored ventilation, respiratory function stable and patient connected to nasal cannula oxygen Cardiovascular status: blood pressure returned to baseline and stable Postop Assessment: no signs of nausea or vomiting Anesthetic complications: no        Last Vitals:  Vitals:   11/12/16 0202 11/12/16 0554  BP: 111/65 108/60  Pulse: (!) 55 (!) 55  Resp:  20  Temp: 36.3 C 36.4 C    Last Pain:  Vitals:   11/12/16 0554  TempSrc: Oral  PainSc:    Pain Goal: Patients Stated Pain Goal: 0 (11/11/16 1210)               Tiajuana Amass

## 2016-11-12 NOTE — Discharge Summary (Signed)
Physician Discharge Summary  Patient ID:  Morgan Mullins  MRN: [id number]  DOB/AGE: Nov 21, 1935 81 y.o.  Admit date: 11/11/2016 Discharge date: 11/12/2016  Discharge Diagnoses:  1.  MALIGNANT NEOPLASM OF LEFT BREAST, STAGE 1, ESTROGEN RECEPTOR POSITIVE (C50.912)             Story: Biopsy of her left breast on 10/10/2016 of 2 areas.             The pathology ([id number]) - 8 c'clock - IDC, ER - 95%, PR - 30%, Ki67 - 15% and 12 o'clock - IDC, ER - 99%, PR - 95%, Ki67 - 5%.             2.  HISTORY OF RIGHT BREAST CANCER (Z85.3)             Story: Treated with right mastectomy in 1992 by Dr. Chauncey Cruel. Blievernicht.             Impression: Disease free 3. BRCA 1/2 negative - March 2013.  4.  HISTORY OF COLON CANCER, STAGE I (Z85.038)   Active Problems:   Breast cancer, stage 1, estrogen receptor positive, left (HCC)  Operation: Procedure(s):  Left MASTECTOMY WITH AXILLARY SENTINEL LYMPH NODE BIOPSY on 11/11/2016 - [facility]  Discharged Condition: good  Hospital Course: Morgan Mullins is an 81 y.o. female whose primary care physician is Simona Huh, MD and who was admitted 11/11/2016 with a chief complaint of left breast cancer.   She was brought to the operating room on 11/11/2016 and underwent  Left MASTECTOMY WITH AXILLARY SENTINEL LYMPH NODE BIOPSY.  She has done well.  She is having almost not pain.  The discharge instructions were reviewed with the patient.  Consults: None  Significant Diagnostic Studies: Results for orders placed or performed during the hospital encounter of 37/10/62  Basic metabolic panel  Result Value Ref Range   Sodium 134 (L) 135 - 145 mmol/L   Potassium 3.8 3.5 - 5.1 mmol/L   Chloride 99 (L) 101 - 111 mmol/L   CO2 26 22 - 32 mmol/L   Glucose, Bld 83 65 - 99 mg/dL   BUN 10 6 - 20 mg/dL   Creatinine, Ser 0.95 0.44 - 1.00 mg/dL   Calcium 9.3 8.9 - 10.3 mg/dL   GFR calc non Af Amer 56 (L) >60 mL/min   GFR calc Af Amer >60 >60 mL/min   Anion gap 9 5 - 15  CBC   Result Value Ref Range   WBC 5.5 4.0 - 10.5 K/uL   RBC 4.11 3.87 - 5.11 MIL/uL   Hemoglobin 12.5 12.0 - 15.0 g/dL   HCT 37.1 36.0 - 46.0 %   MCV 90.3 78.0 - 100.0 fL   MCH 30.4 26.0 - 34.0 pg   MCHC 33.7 30.0 - 36.0 g/dL   RDW 12.8 11.5 - 15.5 %   Platelets 256 150 - 400 K/uL    No results found.  Discharge Exam:  Vitals:   11/12/16 0202 11/12/16 0554  BP: 111/65 108/60  Pulse: (!) 55 (!) 55  Resp:  20  Temp: 97.4 F (36.3 C) 97.5 F (36.4 C)    General: WN older WF who is alert and generally healthy appearing.  Lungs: Clear to auscultation and symmetric breath sounds. Heart:  RRR. No murmur or rub. Chest:  Wound looks okay.  Drains - 1/2 - 28/32 cc  Discharge Medications:   Allergies as of 11/12/2016      Reactions   No Known Allergies  Medication List    TAKE these medications   ALPRAZolam 0.25 MG tablet Commonly known as:  XANAX Take 1 tablet (0.25 mg total) by mouth at bedtime as needed for anxiety.   CALCIUM-VITAMIN D PO Take 600 mg by mouth daily.   cholecalciferol 1000 units tablet Commonly known as:  VITAMIN D Take 1,000 Units by mouth daily.   citalopram 20 MG tablet Commonly known as:  CELEXA Take 1 tablet (20 mg total) by mouth daily.   dorzolamide-timolol 22.3-6.8 MG/ML ophthalmic solution Commonly known as:  COSOPT Place 1 drop into the left eye 2 (two) times daily. Patient takes 1 in the morning and 1 in the evening.   HYDROcodone-acetaminophen 5-325 MG tablet Commonly known as:  NORCO/VICODIN Take 1-2 tablets by mouth every 6 (six) hours as needed for moderate pain.   lisinopril-hydrochlorothiazide 20-12.5 MG tablet Commonly known as:  PRINZIDE,ZESTORETIC Take 1 tablet by mouth daily.   simvastatin 40 MG tablet Commonly known as:  ZOCOR Take 40 mg by mouth every morning.   TRAVATAN Z 0.004 % Soln ophthalmic solution Generic drug:  Travoprost (BAK Free) Place 1 drop into the left eye at bedtime.   zolpidem 10 MG  tablet Commonly known as:  AMBIEN 10 mg at bedtime as needed for sleep.       Disposition: 01-Home or Self Care  Discharge Instructions    Diet - low sodium heart healthy    Complete by:  As directed    Increase activity slowly    Complete by:  As directed      Activity:          Driving - May drive in 3 or 4 days                         Lifting - No lifting more than 15 pounds  Wound Care:   May shower in 3 or 4 days         Empty the drains once or twice a day - record the amount  Diet:  As tolerated  Follow up appointment:  Call Dr. Pollie Friar office [facility]) at [phone] for an appointment in next week  Medications and dosages:             Resume your home medications.             You have a prescription for:  Vicodin    Signed: Alphonsa Overall, M.D., [facility] Surgery Office:  [phone]  11/12/2016, 7:36 AM

## 2016-11-12 NOTE — Discharge Instructions (Signed)
[facility] - DISCHARGE INSTRUCTIONS TO PATIENT  Activity:  Driving - May drive in 3 or 4 days   Lifting - No lifting more than 15 pounds  Wound Care:   May shower in 3 or 4 days         Empty the drains once or twice a day - record the amount  Diet:  As tolerated  Follow up appointment:  Call Dr. Pollie Friar office [facility]) at [phone] for an appointment in next week  Medications and dosages:  Resume your home medications.  You have a prescription for:  Vicodin  Call Dr. Lucia Gaskins or his office  [phone]) if you have:  Temperature greater than 100.4,  Persistent nausea and vomiting,  Severe uncontrolled pain,  Redness, tenderness, or signs of infection (pain, swelling, redness, odor or green/yellow discharge around the site),  Difficulty breathing, headache or visual disturbances,  Any other questions or concerns you may have after discharge.  In an emergency, call 911 or go to an Emergency Department at a nearby hospital.

## 2016-11-13 ENCOUNTER — Encounter: Payer: Self-pay | Admitting: Oncology

## 2016-11-18 ENCOUNTER — Telehealth: Payer: Self-pay | Admitting: Oncology

## 2016-11-18 NOTE — Telephone Encounter (Signed)
Pt has cld to cancel her appt w/Dr. Jana Hakim. She was very adamant about not needing the appt. Pt was really pleasant, but states she will not need the appt. I voiced understanding and cancelled her appt.

## 2016-11-26 ENCOUNTER — Other Ambulatory Visit: Payer: Medicare Other

## 2016-11-26 ENCOUNTER — Ambulatory Visit: Payer: Medicare Other | Admitting: Oncology

## 2016-12-02 ENCOUNTER — Other Ambulatory Visit: Payer: Self-pay | Admitting: Oncology

## 2016-12-11 DIAGNOSIS — I10 Essential (primary) hypertension: Secondary | ICD-10-CM | POA: Diagnosis not present

## 2016-12-11 DIAGNOSIS — H409 Unspecified glaucoma: Secondary | ICD-10-CM | POA: Diagnosis not present

## 2016-12-11 DIAGNOSIS — G479 Sleep disorder, unspecified: Secondary | ICD-10-CM | POA: Diagnosis not present

## 2016-12-11 DIAGNOSIS — E7800 Pure hypercholesterolemia, unspecified: Secondary | ICD-10-CM | POA: Diagnosis not present

## 2016-12-11 DIAGNOSIS — D692 Other nonthrombocytopenic purpura: Secondary | ICD-10-CM | POA: Diagnosis not present

## 2017-01-22 DIAGNOSIS — H4052X1 Glaucoma secondary to other eye disorders, left eye, mild stage: Secondary | ICD-10-CM | POA: Diagnosis not present

## 2017-01-22 DIAGNOSIS — H40051 Ocular hypertension, right eye: Secondary | ICD-10-CM | POA: Diagnosis not present

## 2017-03-05 DIAGNOSIS — H4052X1 Glaucoma secondary to other eye disorders, left eye, mild stage: Secondary | ICD-10-CM | POA: Diagnosis not present

## 2017-03-05 DIAGNOSIS — H40051 Ocular hypertension, right eye: Secondary | ICD-10-CM | POA: Diagnosis not present

## 2017-03-20 NOTE — Progress Notes (Signed)
81 y.o. G0P0000 MarriedCaucasianF here for annual exam.  Doing well.  No vaginal bleeding.  Doing well since surgery from February.  PCP:  Dr. Marisue Humble.  Has upcoming appt.  Patient's last menstrual period was 09/17/1987.          Sexually active: No.  The current method of family planning is post menopausal status.    Exercising: No.  The patient does not participate in regular exercise at present. Smoker:  no  Health Maintenance: Pap:  10/06/14 negative  History of abnormal Pap:  no MMG:  10/10/16 [place] BIRADS 5 suspicious-  Left mastectomy 11/11/16  Colonoscopy:  07/2012- repeat 5 years  BMD:   07/2010 TDaP:  ~2012  Pneumonia vaccine(s):  Done Zostavax:   Done  Hep C testing: not indicated  Screening Labs: PCP   reports that she has never smoked. She has never used smokeless tobacco. She reports that she does not drink alcohol or use drugs.  Past Medical History:  Diagnosis Date  . Breast cancer, right breast ([facility] [facility]) 1/92   S/P mastectomy  . Cancer of left breast ([facility]) 09/2016   S/P mastectomy 11/13/2016  . Colon cancer ([facility]) 2001   S/P "took 11 inches of my colon out"  . Colon polyps   . DJD (degenerative joint disease)    arthritis in neck  . Glaucoma   . Hyperlipidemia   . Hypertension   . Infertility   . Osteopenia   . PONV (postoperative nausea and vomiting)     Past Surgical History:  Procedure Laterality Date  . BREAST BIOPSY Right 1992  . BREAST BIOPSY Left 09/2016  . CATARACT EXTRACTION W/ INTRAOCULAR LENS  IMPLANT, BILATERAL Bilateral 2002-2003   left 10/02, right 2003  . CHOLECYSTECTOMY OPEN  04/1984  . COLECTOMY  02/20/2000   h/o colon resection secondary to villous adenoma  . MASTECTOMY Right 09/1990  . MASTECTOMY COMPLETE / SIMPLE W/ SENTINEL NODE BIOPSY Left 11/11/2016   axillary  . MASTECTOMY W/ SENTINEL NODE BIOPSY Left 11/11/2016   Procedure: MASTECTOMY WITH AXILLARY SENTINEL LYMPH NODE BIOPSY;  Surgeon: Alphonsa Overall, MD;  Location: [facility];  Service:  General;  Laterality: Left;  . TUBAL LIGATION      Current Outpatient Prescriptions  Medication Sig Dispense Refill  . ALPRAZolam (XANAX) 0.25 MG tablet Take 1 tablet (0.25 mg total) by mouth at bedtime as needed for anxiety. 30 tablet 1  . CALCIUM-VITAMIN D PO Take 600 mg by mouth daily.     . cholecalciferol (VITAMIN D) 1000 units tablet Take 1,000 Units by mouth daily.      . citalopram (CELEXA) 20 MG tablet Take 1 tablet (20 mg total) by mouth daily. 90 tablet 4  . dorzolamide-timolol (COSOPT) 22.3-6.8 MG/ML ophthalmic solution Place 1 drop into the left eye 2 (two) times daily. Patient takes 1 in the morning and 1 in the evening.    Marland Kitchen HYDROcodone-acetaminophen (NORCO/VICODIN) 5-325 MG tablet Take 1-2 tablets by mouth every 6 (six) hours as needed for moderate pain. 20 tablet 0  . lisinopril-hydrochlorothiazide (PRINZIDE,ZESTORETIC) 20-12.5 MG per tablet Take 1 tablet by mouth daily.    . simvastatin (ZOCOR) 40 MG tablet Take 40 mg by mouth every morning.     . TRAVATAN Z 0.004 % SOLN ophthalmic solution Place 1 drop into the left eye at bedtime.   11  . zolpidem (AMBIEN) 10 MG tablet 10 mg at bedtime as needed for sleep.   0   No current facility-administered medications for this  visit.     Family History  Problem Relation Age of Onset  . Cancer Mother        ovarian  . Cancer Father        colon  . Cancer Paternal Aunt        breast    ROS:  Pertinent items are noted in HPI.  Otherwise, a comprehensive ROS was negative.  Exam:   BP 140/88 (BP Location: Right Arm, Patient Position: Sitting, Cuff Size: Normal)   Pulse (!) 58   Resp 16   Ht 5' 4.75" (1.645 m)   Wt 137 lb (62.1 kg)   LMP 09/17/1987   BMI 22.97 kg/m    Height: 5' 4.75" (164.5 cm)  Ht Readings from Last 3 Encounters:  03/21/17 5' 4.75" (1.645 m)  11/11/16 5\' 5"  (1.651 m)  11/08/16 5\' 5"  (1.651 m)    General appearance: alert, cooperative and appears stated age Head: Normocephalic, without obvious  abnormality, atraumatic Neck: no adenopathy, supple, symmetrical, trachea midline and thyroid normal to inspection and palpation Lungs: clear to auscultation bilaterally Breasts: absent bilateral breasts, well healed scars, no LAD Heart: regular rate and rhythm Abdomen: soft, non-tender; bowel sounds normal; no masses,  no organomegaly Extremities: extremities normal, atraumatic, no cyanosis or edema Skin: Skin color, texture, turgor normal. No rashes or lesions Lymph nodes: Cervical, supraclavicular, and axillary nodes normal. No abnormal inguinal nodes palpated Neurologic: Grossly normal   Pelvic: External genitalia:  no lesions              Urethra:  normal appearing urethra with no masses, tenderness or lesions              Bartholins and Skenes: normal                 Vagina: normal appearing vagina with normal color and discharge, no lesions              Cervix: no lesions              Pap taken: Yes.   Bimanual Exam:  Uterus:  normal size, contour, position, consistency, mobility, non-tender              Adnexa: normal adnexa and no mass, fullness, tenderness               Rectovaginal: Confirms               Anus:  normal sphincter tone, no lesions  Chaperone was present for exam.  A:  Well Woman with normal exam PMP, no HRT H/O breast cancer s/p right mastectomy 1992 and then invasive lobular 2/18 on the left with negative LND.  Pt reports no additional therapy was recommended. H/O colon resection due to tubovillous adenoma 2001, small area with colon cancer. Hypertension Elevated lipids Osteopenia Glaucoma   P:   Mammograms not indicated any longer Rx for Celexa 20mg  daily.  #90/4RF Rx for xanax 0.25mg  prn.  #3-/1RF Lab work will be done with Dr. Marisue Humble.  She will discuss the new shingles vaccine at next follow-up Colonoscopy due but last one could not be completed due to scarring from prior surgery.  Cologuard will be ordered. pap smear obtained today Return  annually or prn

## 2017-03-21 ENCOUNTER — Encounter: Payer: Self-pay | Admitting: Obstetrics & Gynecology

## 2017-03-21 ENCOUNTER — Ambulatory Visit (INDEPENDENT_AMBULATORY_CARE_PROVIDER_SITE_OTHER): Payer: Medicare Other | Admitting: Obstetrics & Gynecology

## 2017-03-21 ENCOUNTER — Other Ambulatory Visit (HOSPITAL_COMMUNITY)
Admission: RE | Admit: 2017-03-21 | Discharge: 2017-03-21 | Disposition: A | Discharge status: Home / Self Care | Payer: Medicare Other | Source: Ambulatory Visit | Attending: Obstetrics & Gynecology | Admitting: Obstetrics & Gynecology

## 2017-03-21 VITALS — BP 140/88 | HR 58 | Resp 16 | Ht 64.75 in | Wt 137.0 lb

## 2017-03-21 DIAGNOSIS — Z124 Encounter for screening for malignant neoplasm of cervix: Secondary | ICD-10-CM | POA: Insufficient documentation

## 2017-03-21 DIAGNOSIS — Z01419 Encounter for gynecological examination (general) (routine) without abnormal findings: Secondary | ICD-10-CM | POA: Diagnosis not present

## 2017-03-21 DIAGNOSIS — Z1211 Encounter for screening for malignant neoplasm of colon: Secondary | ICD-10-CM | POA: Diagnosis not present

## 2017-03-21 MED ORDER — ALPRAZOLAM 0.25 MG PO TABS: 0.2500 mg | ORAL_TABLET | Freq: Every evening | ORAL | 1 refills | Status: AC | PRN

## 2017-03-21 MED ORDER — CITALOPRAM HYDROBROMIDE 20 MG PO TABS: 20.0000 mg | ORAL_TABLET | Freq: Every day | ORAL | 4 refills | Status: DC

## 2017-03-24 LAB — CYTOLOGY - PAP: DIAGNOSIS: NEGATIVE

## 2017-04-01 DIAGNOSIS — Z1211 Encounter for screening for malignant neoplasm of colon: Secondary | ICD-10-CM | POA: Diagnosis not present

## 2017-04-01 DIAGNOSIS — Z1212 Encounter for screening for malignant neoplasm of rectum: Secondary | ICD-10-CM | POA: Diagnosis not present

## 2017-04-14 ENCOUNTER — Telehealth: Payer: Self-pay | Admitting: *Deleted

## 2017-04-14 DIAGNOSIS — Z1211 Encounter for screening for malignant neoplasm of colon: Secondary | ICD-10-CM

## 2017-04-14 LAB — COLOGUARD: Cologuard: NEGATIVE

## 2017-04-14 NOTE — Telephone Encounter (Signed)
Attempted to reach patient to review Cologuard results. Busy signal.

## 2017-04-14 NOTE — Telephone Encounter (Signed)
Patient notified of negative Cologuard results. Verbalized understanding. Encounter closed.

## 2017-05-07 DIAGNOSIS — H40051 Ocular hypertension, right eye: Secondary | ICD-10-CM | POA: Diagnosis not present

## 2017-05-07 DIAGNOSIS — H4052X1 Glaucoma secondary to other eye disorders, left eye, mild stage: Secondary | ICD-10-CM | POA: Diagnosis not present

## 2017-06-05 DIAGNOSIS — C50912 Malignant neoplasm of unspecified site of left female breast: Secondary | ICD-10-CM | POA: Diagnosis not present

## 2017-06-05 DIAGNOSIS — Z85038 Personal history of other malignant neoplasm of large intestine: Secondary | ICD-10-CM | POA: Diagnosis not present

## 2017-06-05 DIAGNOSIS — Z170 Estrogen receptor positive status [ER+]: Secondary | ICD-10-CM | POA: Diagnosis not present

## 2017-06-05 DIAGNOSIS — Z853 Personal history of malignant neoplasm of breast: Secondary | ICD-10-CM | POA: Diagnosis not present

## 2017-06-05 DIAGNOSIS — D171 Benign lipomatous neoplasm of skin and subcutaneous tissue of trunk: Secondary | ICD-10-CM | POA: Diagnosis not present

## 2017-06-17 DIAGNOSIS — Z23 Encounter for immunization: Secondary | ICD-10-CM | POA: Diagnosis not present

## 2017-06-17 DIAGNOSIS — D692 Other nonthrombocytopenic purpura: Secondary | ICD-10-CM | POA: Diagnosis not present

## 2017-06-17 DIAGNOSIS — Z0000 Encounter for general adult medical examination without abnormal findings: Secondary | ICD-10-CM | POA: Diagnosis not present

## 2017-06-17 DIAGNOSIS — Z853 Personal history of malignant neoplasm of breast: Secondary | ICD-10-CM | POA: Diagnosis not present

## 2017-06-17 DIAGNOSIS — Z85038 Personal history of other malignant neoplasm of large intestine: Secondary | ICD-10-CM | POA: Diagnosis not present

## 2017-06-17 DIAGNOSIS — E7800 Pure hypercholesterolemia, unspecified: Secondary | ICD-10-CM | POA: Diagnosis not present

## 2017-06-17 DIAGNOSIS — H409 Unspecified glaucoma: Secondary | ICD-10-CM | POA: Diagnosis not present

## 2017-06-17 DIAGNOSIS — I10 Essential (primary) hypertension: Secondary | ICD-10-CM | POA: Diagnosis not present

## 2017-06-17 DIAGNOSIS — G479 Sleep disorder, unspecified: Secondary | ICD-10-CM | POA: Diagnosis not present

## 2017-06-18 DIAGNOSIS — H40051 Ocular hypertension, right eye: Secondary | ICD-10-CM | POA: Diagnosis not present

## 2017-06-18 DIAGNOSIS — H4052X1 Glaucoma secondary to other eye disorders, left eye, mild stage: Secondary | ICD-10-CM | POA: Diagnosis not present

## 2017-10-29 DIAGNOSIS — H40051 Ocular hypertension, right eye: Secondary | ICD-10-CM | POA: Diagnosis not present

## 2017-10-29 DIAGNOSIS — H4052X1 Glaucoma secondary to other eye disorders, left eye, mild stage: Secondary | ICD-10-CM | POA: Diagnosis not present

## 2017-11-26 DIAGNOSIS — H40051 Ocular hypertension, right eye: Secondary | ICD-10-CM | POA: Diagnosis not present

## 2017-11-26 DIAGNOSIS — H4052X1 Glaucoma secondary to other eye disorders, left eye, mild stage: Secondary | ICD-10-CM | POA: Diagnosis not present

## 2017-12-11 DIAGNOSIS — D171 Benign lipomatous neoplasm of skin and subcutaneous tissue of trunk: Secondary | ICD-10-CM | POA: Diagnosis not present

## 2017-12-11 DIAGNOSIS — C50912 Malignant neoplasm of unspecified site of left female breast: Secondary | ICD-10-CM | POA: Diagnosis not present

## 2017-12-11 DIAGNOSIS — Z170 Estrogen receptor positive status [ER+]: Secondary | ICD-10-CM | POA: Diagnosis not present

## 2017-12-11 DIAGNOSIS — Z853 Personal history of malignant neoplasm of breast: Secondary | ICD-10-CM | POA: Diagnosis not present

## 2017-12-11 DIAGNOSIS — Z85038 Personal history of other malignant neoplasm of large intestine: Secondary | ICD-10-CM | POA: Diagnosis not present

## 2017-12-30 DIAGNOSIS — H409 Unspecified glaucoma: Secondary | ICD-10-CM | POA: Diagnosis not present

## 2017-12-30 DIAGNOSIS — Z85038 Personal history of other malignant neoplasm of large intestine: Secondary | ICD-10-CM | POA: Diagnosis not present

## 2017-12-30 DIAGNOSIS — I10 Essential (primary) hypertension: Secondary | ICD-10-CM | POA: Diagnosis not present

## 2017-12-30 DIAGNOSIS — Z853 Personal history of malignant neoplasm of breast: Secondary | ICD-10-CM | POA: Diagnosis not present

## 2017-12-30 DIAGNOSIS — G479 Sleep disorder, unspecified: Secondary | ICD-10-CM | POA: Diagnosis not present

## 2017-12-30 DIAGNOSIS — D692 Other nonthrombocytopenic purpura: Secondary | ICD-10-CM | POA: Diagnosis not present

## 2017-12-30 DIAGNOSIS — F411 Generalized anxiety disorder: Secondary | ICD-10-CM | POA: Diagnosis not present

## 2017-12-30 DIAGNOSIS — E7800 Pure hypercholesterolemia, unspecified: Secondary | ICD-10-CM | POA: Diagnosis not present

## 2018-01-07 DIAGNOSIS — H4052X1 Glaucoma secondary to other eye disorders, left eye, mild stage: Secondary | ICD-10-CM | POA: Diagnosis not present

## 2018-01-28 ENCOUNTER — Other Ambulatory Visit: Payer: Self-pay | Admitting: Family Medicine

## 2018-01-28 DIAGNOSIS — R42 Dizziness and giddiness: Secondary | ICD-10-CM

## 2018-01-28 DIAGNOSIS — H4052X1 Glaucoma secondary to other eye disorders, left eye, mild stage: Secondary | ICD-10-CM | POA: Diagnosis not present

## 2018-01-28 DIAGNOSIS — H40051 Ocular hypertension, right eye: Secondary | ICD-10-CM | POA: Diagnosis not present

## 2018-02-03 ENCOUNTER — Ambulatory Visit
Admission: RE | Admit: 2018-02-03 | Discharge: 2018-02-03 | Disposition: A | Discharge status: Home / Self Care | Payer: Medicare Other | Source: Ambulatory Visit | Attending: Family Medicine | Admitting: Family Medicine

## 2018-02-03 DIAGNOSIS — R2681 Unsteadiness on feet: Secondary | ICD-10-CM | POA: Diagnosis not present

## 2018-02-03 DIAGNOSIS — R42 Dizziness and giddiness: Secondary | ICD-10-CM

## 2018-02-16 DIAGNOSIS — H4052X1 Glaucoma secondary to other eye disorders, left eye, mild stage: Secondary | ICD-10-CM | POA: Diagnosis not present

## 2018-03-11 DIAGNOSIS — H401131 Primary open-angle glaucoma, bilateral, mild stage: Secondary | ICD-10-CM | POA: Diagnosis not present

## 2018-03-16 DIAGNOSIS — H4052X1 Glaucoma secondary to other eye disorders, left eye, mild stage: Secondary | ICD-10-CM | POA: Diagnosis not present

## 2018-03-30 DIAGNOSIS — H4052X1 Glaucoma secondary to other eye disorders, left eye, mild stage: Secondary | ICD-10-CM | POA: Diagnosis not present

## 2018-05-07 DIAGNOSIS — H401111 Primary open-angle glaucoma, right eye, mild stage: Secondary | ICD-10-CM | POA: Diagnosis not present

## 2018-05-07 DIAGNOSIS — H4052X1 Glaucoma secondary to other eye disorders, left eye, mild stage: Secondary | ICD-10-CM | POA: Diagnosis not present

## 2018-05-07 DIAGNOSIS — Z961 Presence of intraocular lens: Secondary | ICD-10-CM | POA: Diagnosis not present

## 2018-06-01 DIAGNOSIS — H401111 Primary open-angle glaucoma, right eye, mild stage: Secondary | ICD-10-CM | POA: Diagnosis not present

## 2018-06-01 DIAGNOSIS — H4052X1 Glaucoma secondary to other eye disorders, left eye, mild stage: Secondary | ICD-10-CM | POA: Diagnosis not present

## 2018-06-02 ENCOUNTER — Encounter: Payer: Self-pay | Admitting: Obstetrics & Gynecology

## 2018-06-02 ENCOUNTER — Other Ambulatory Visit: Payer: Self-pay

## 2018-06-02 ENCOUNTER — Ambulatory Visit (INDEPENDENT_AMBULATORY_CARE_PROVIDER_SITE_OTHER): Payer: Medicare Other | Admitting: Obstetrics & Gynecology

## 2018-06-02 VITALS — BP 150/80 | HR 60 | Resp 14 | Ht 64.75 in | Wt 132.4 lb

## 2018-06-02 DIAGNOSIS — Z124 Encounter for screening for malignant neoplasm of cervix: Secondary | ICD-10-CM | POA: Diagnosis not present

## 2018-06-02 DIAGNOSIS — Z01419 Encounter for gynecological examination (general) (routine) without abnormal findings: Secondary | ICD-10-CM | POA: Diagnosis not present

## 2018-06-02 NOTE — Progress Notes (Signed)
82 y.o. G0P0000 Married White or Caucasian female here for annual exam.  Doing well.  Denies vaginal bleeding.  Biggest issue for her this past year has been glaucoma.  Medications have been changed multiple times.  Reports she feels very swimmy headed with the eye drops she's been prescribed.  Had left breast cancer 2/18.  Decided to treat with mastectomy.  Has neither breast.  Did not have to do any additional therapy.  No seeing oncology at this point.  Will see Dr. Lucia Gaskins yearly.  PCP:  Dr. Marisue Humble  Patient's last menstrual period was 09/17/1987.          Sexually active: No.  The current method of family planning is post menopausal status.    Exercising: Yes.    walk Smoker:  no  Health Maintenance: Pap:  03/21/17 Neg   10/06/14 neg  History of abnormal Pap:  no MMG:  10/10/16 [place] BIRADS:5 suspicious-  Left mastectomy 11/11/16. Colonoscopy:  Cologuard was negative last year in 2018 BMD:   2011 TDaP:  2012 Pneumonia vaccine(s):  done Shingrix:   D/w pt today.  Not interested in having this at this time. Screening Labs: PCP   reports that she has never smoked. She has never used smokeless tobacco. She reports that she does not drink alcohol or use drugs.  Past Medical History:  Diagnosis Date  . Breast cancer, right breast ([place]) 1/92   S/P mastectomy  . Cancer of left breast ([facility]) 09/2016   S/P mastectomy 11/13/2016  . Colon cancer ([place]) 2001   S/P "took 11 inches of my colon out"  . Colon polyps   . DJD (degenerative joint disease)    arthritis in neck  . Glaucoma   . Hyperlipidemia   . Hypertension   . Infertility   . Osteopenia   . PONV (postoperative nausea and vomiting)     Past Surgical History:  Procedure Laterality Date  . BREAST BIOPSY Right 1992  . BREAST BIOPSY Left 09/2016  . CATARACT EXTRACTION W/ INTRAOCULAR LENS  IMPLANT, BILATERAL Bilateral 2002-2003   left 10/02, right 2003  . CHOLECYSTECTOMY OPEN  04/1984  . COLECTOMY  02/20/2000   h/o colon resection  secondary to villous adenoma  . MASTECTOMY Right 09/1990  . MASTECTOMY COMPLETE / SIMPLE W/ SENTINEL NODE BIOPSY Left 11/11/2016   axillary  . MASTECTOMY W/ SENTINEL NODE BIOPSY Left 11/11/2016   Procedure: MASTECTOMY WITH AXILLARY SENTINEL LYMPH NODE BIOPSY;  Surgeon: Alphonsa Overall, MD;  Location: [facility];  Service: General;  Laterality: Left;  . TUBAL LIGATION      Current Outpatient Medications  Medication Sig Dispense Refill  . ALPRAZolam (XANAX) 0.25 MG tablet Take 1 tablet (0.25 mg total) by mouth at bedtime as needed for anxiety. 30 tablet 1  . brimonidine (ALPHAGAN) 0.2 % ophthalmic solution daily.    Marland Kitchen CALCIUM-VITAMIN D PO Take 600 mg by mouth daily.     . cholecalciferol (VITAMIN D) 1000 units tablet Take 1,000 Units by mouth daily.      . citalopram (CELEXA) 20 MG tablet Take 1 tablet (20 mg total) by mouth daily. 90 tablet 4  . dorzolamide (TRUSOPT) 2 % ophthalmic solution daily.  11  . latanoprost (XALATAN) 0.005 % ophthalmic solution daily.    Marland Kitchen lisinopril-hydrochlorothiazide (PRINZIDE,ZESTORETIC) 20-12.5 MG per tablet Take 1 tablet by mouth daily.    . simvastatin (ZOCOR) 40 MG tablet Take 40 mg by mouth every morning.     . zolpidem (AMBIEN) 10 MG tablet  10 mg at bedtime as needed for sleep.   0   No current facility-administered medications for this visit.     Family History  Problem Relation Age of Onset  . Cancer Mother        ovarian  . Cancer Father        colon  . Cancer Paternal Aunt        breast    Review of Systems  All other systems reviewed and are negative.   Exam:   BP (!) 150/80 (BP Location: Right Arm, Patient Position: Sitting, Cuff Size: Normal)   Pulse 60   Resp 14   Ht 5' 4.75" (1.645 m)   Wt 132 lb 6.4 oz (60.1 kg)   LMP 09/17/1987   BMI 22.20 kg/m   Height: 5' 4.75" (164.5 cm)  Ht Readings from Last 3 Encounters:  06/02/18 5' 4.75" (1.645 m)  03/21/17 5' 4.75" (1.645 m)  11/11/16 5\' 5"  (1.651 m)    General appearance: alert,  cooperative and appears stated age Head: Normocephalic, without obvious abnormality, atraumatic Neck: no adenopathy, supple, symmetrical, trachea midline and thyroid normal to inspection and palpation Lungs: clear to auscultation bilaterally Breasts: both breasts are surgically absent, well healed scars, no LAD or masses noted Heart: regular rate and rhythm Abdomen: soft, non-tender; bowel sounds normal; no masses,  no organomegaly Extremities: extremities normal, atraumatic, no cyanosis or edema Skin: Skin color, texture, turgor normal. No rashes or lesions Lymph nodes: Cervical, supraclavicular, and axillary nodes normal. No abnormal inguinal nodes palpated Neurologic: Grossly normal   Pelvic: External genitalia:  no lesions              Urethra:  normal appearing urethra with no masses, tenderness or lesions              Bartholins and Skenes: normal                 Vagina: normal appearing vagina with normal color and discharge, no lesions              Cervix: no lesions              Pap taken: No. Bimanual Exam:  Uterus:  normal size, contour, position, consistency, mobility, non-tender              Adnexa: normal adnexa and no mass, fullness, tenderness               Rectovaginal: Confirms               Anus:  normal sphincter tone, no lesions  Chaperone was present for exam.  A:  Well Woman with normal exam PMP, no HRT H/O right breast cancer s/p mastectomy 1992, then invasive lobular 2/18 with neg LND.  No additional therapy indicated.  Following with breast surgeon yearly. Hypertension H/O elevated lipids Osteopenia H/o glaucoma  P:   Mammograms not indicated as she has undergone bilateral mastectomy pap smear not obtained RF for celexa 20mg  daily to pharmacy.  #90/4RF No xanax rx needed at this time Declines shingrix vaccine at this time Lab work done with Dr. Marisue Humble return annually or prn

## 2018-06-05 MED ORDER — CITALOPRAM HYDROBROMIDE 20 MG PO TABS: 20.0000 mg | ORAL_TABLET | Freq: Every day | ORAL | 4 refills | Status: DC

## 2018-06-26 DIAGNOSIS — L821 Other seborrheic keratosis: Secondary | ICD-10-CM | POA: Diagnosis not present

## 2018-06-26 DIAGNOSIS — Z85828 Personal history of other malignant neoplasm of skin: Secondary | ICD-10-CM | POA: Diagnosis not present

## 2018-07-01 DIAGNOSIS — H401112 Primary open-angle glaucoma, right eye, moderate stage: Secondary | ICD-10-CM | POA: Diagnosis not present

## 2018-07-01 DIAGNOSIS — H401121 Primary open-angle glaucoma, left eye, mild stage: Secondary | ICD-10-CM | POA: Diagnosis not present

## 2018-07-07 DIAGNOSIS — Z853 Personal history of malignant neoplasm of breast: Secondary | ICD-10-CM | POA: Diagnosis not present

## 2018-07-07 DIAGNOSIS — Z85038 Personal history of other malignant neoplasm of large intestine: Secondary | ICD-10-CM | POA: Diagnosis not present

## 2018-07-07 DIAGNOSIS — Z0000 Encounter for general adult medical examination without abnormal findings: Secondary | ICD-10-CM | POA: Diagnosis not present

## 2018-07-07 DIAGNOSIS — E7800 Pure hypercholesterolemia, unspecified: Secondary | ICD-10-CM | POA: Diagnosis not present

## 2018-07-07 DIAGNOSIS — H409 Unspecified glaucoma: Secondary | ICD-10-CM | POA: Diagnosis not present

## 2018-07-07 DIAGNOSIS — Z23 Encounter for immunization: Secondary | ICD-10-CM | POA: Diagnosis not present

## 2018-07-07 DIAGNOSIS — G479 Sleep disorder, unspecified: Secondary | ICD-10-CM | POA: Diagnosis not present

## 2018-07-07 DIAGNOSIS — I10 Essential (primary) hypertension: Secondary | ICD-10-CM | POA: Diagnosis not present

## 2018-07-07 DIAGNOSIS — Z1389 Encounter for screening for other disorder: Secondary | ICD-10-CM | POA: Diagnosis not present

## 2018-07-07 DIAGNOSIS — D692 Other nonthrombocytopenic purpura: Secondary | ICD-10-CM | POA: Diagnosis not present

## 2018-07-07 DIAGNOSIS — F411 Generalized anxiety disorder: Secondary | ICD-10-CM | POA: Diagnosis not present

## 2018-07-08 DIAGNOSIS — H401112 Primary open-angle glaucoma, right eye, moderate stage: Secondary | ICD-10-CM | POA: Diagnosis not present

## 2018-07-08 DIAGNOSIS — H401121 Primary open-angle glaucoma, left eye, mild stage: Secondary | ICD-10-CM | POA: Diagnosis not present

## 2018-07-09 IMAGING — CT CT HEAD W/O CM
1 series · 16 of 30 positions shown, 20 images · non-contrast
Comparison: None

CLINICAL DATA: Unsteady gait, history of breast cancer and colon
cancer, hypertension

EXAM:
CT HEAD WITHOUT CONTRAST
TECHNIQUE: Contiguous axial images were obtained from the base of the skull
through the vertex without intravenous contrast. Sagittal and
coronal MPR images reconstructed from axial data set.

[Series 2: head w/(date) · axial · 0.41mm/px · z∈[-166,-26]mm · 16 of 32 slices shown, 20 images]
[im 2/32  brain]
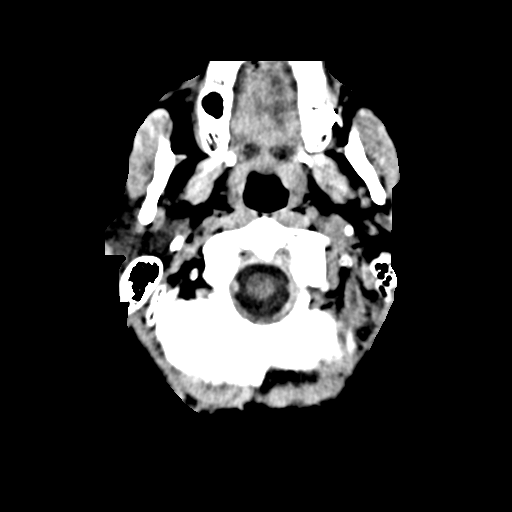
[im 2/32  bone]
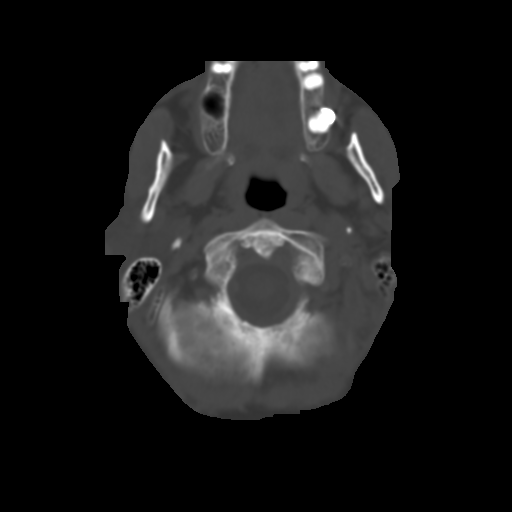
[im 4/32  brain]
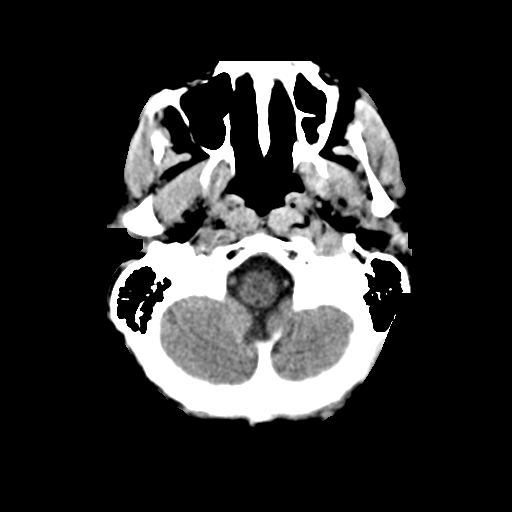
[im 6/32  brain]
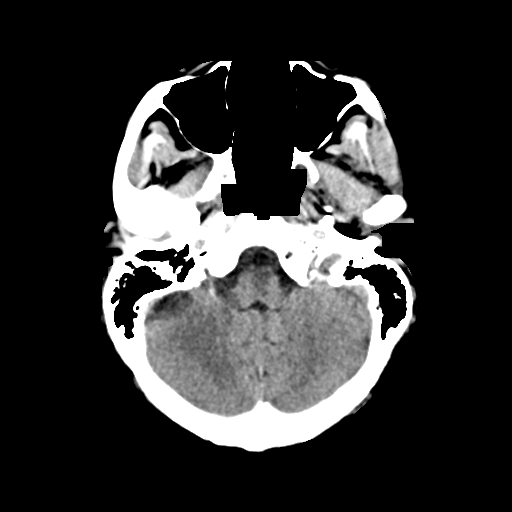
[im 8/32  brain]
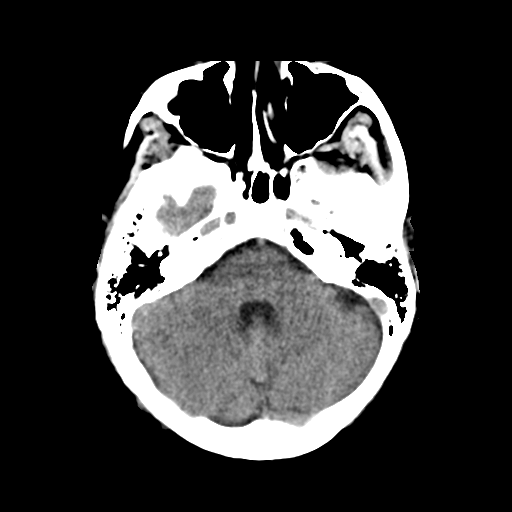
[im 9/32  brain]
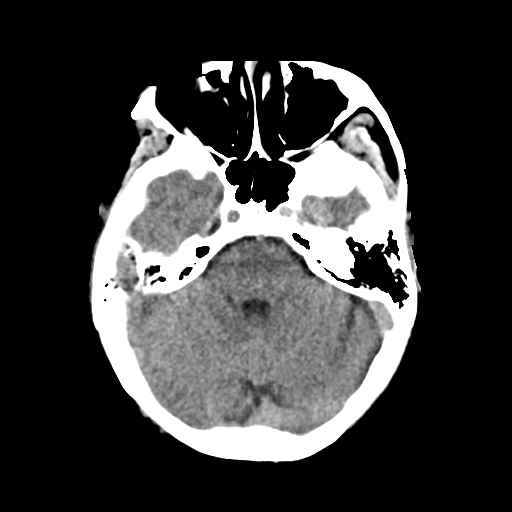
[im 9/32  bone]
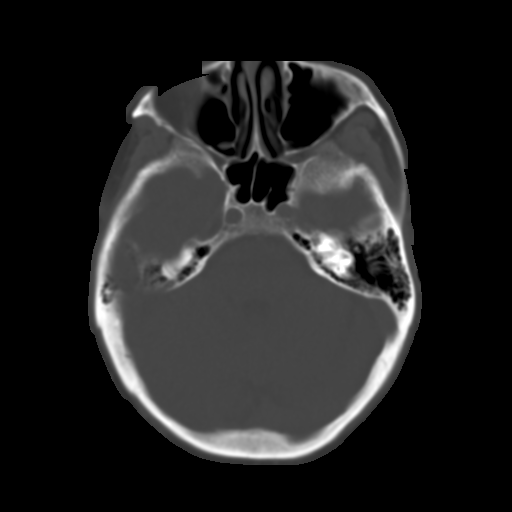
[im 11/32  brain]
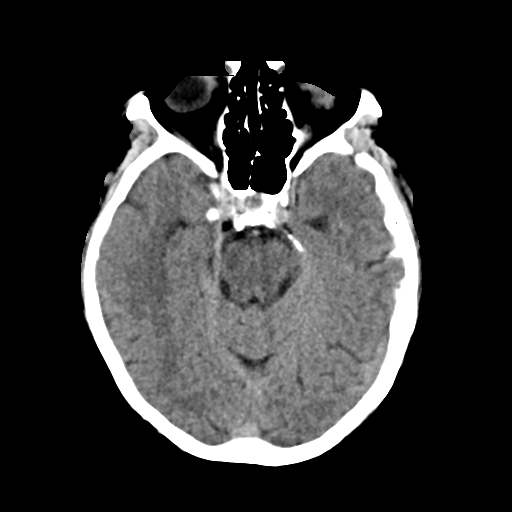
[im 13/32  brain]
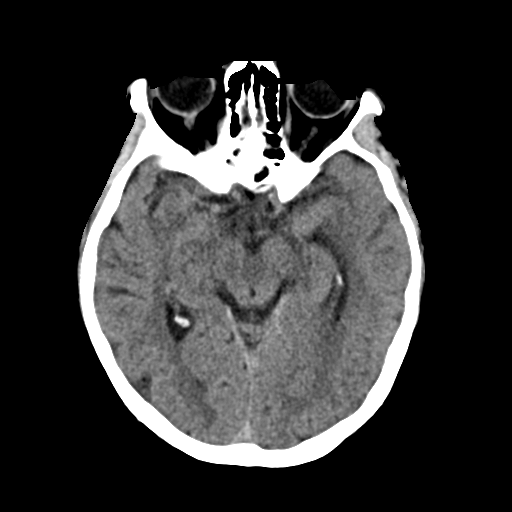
[im 15/32  brain]
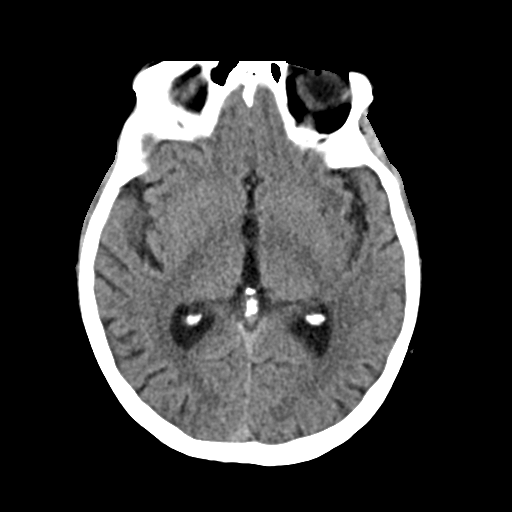
[im 17/32  brain]
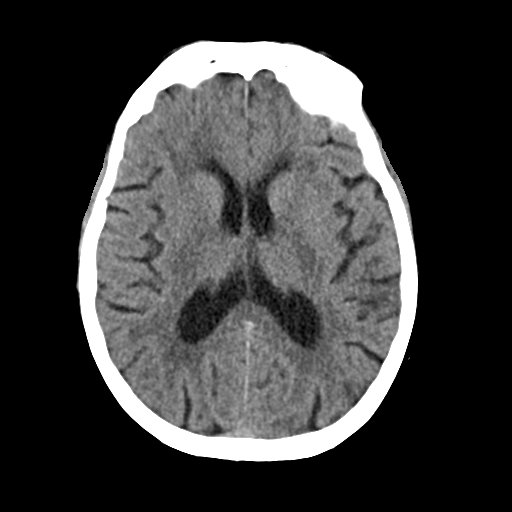
[im 17/32  bone]
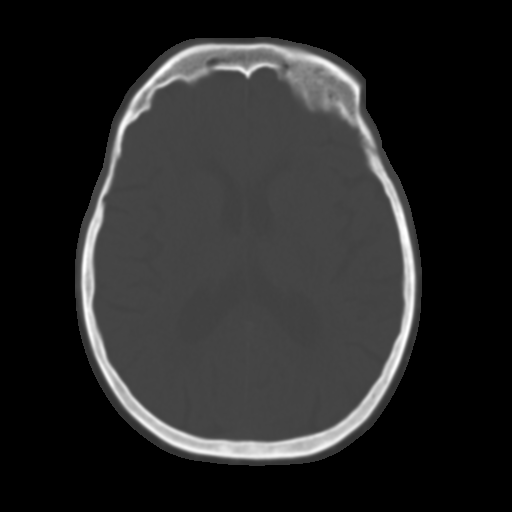
[im 19/32  brain]
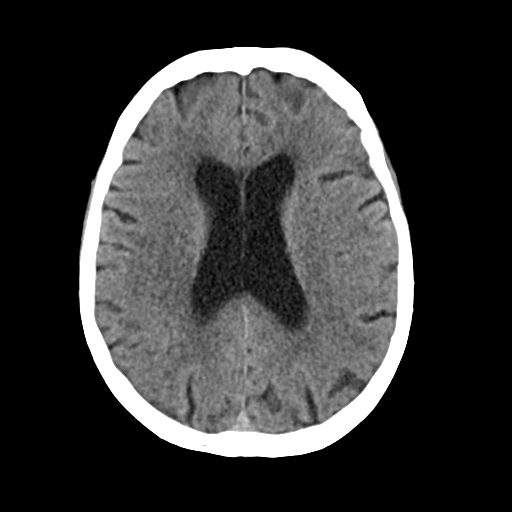
[im 21/32  brain]
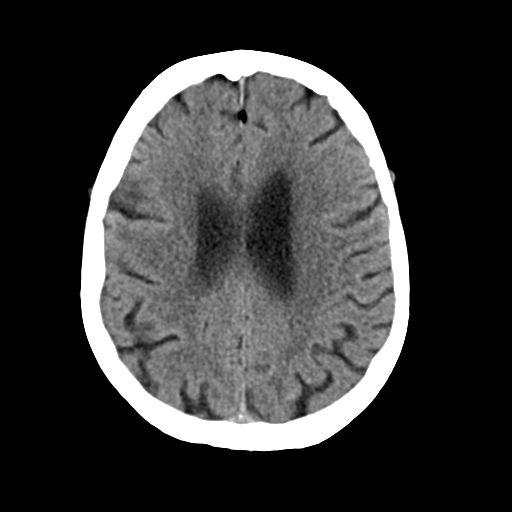
[im 23/32  brain]
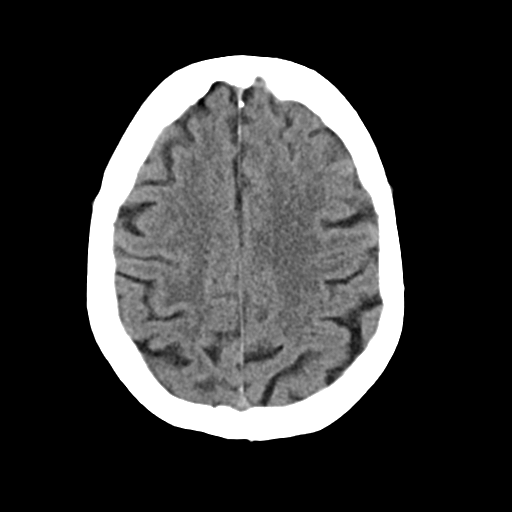
[im 24/32  brain]
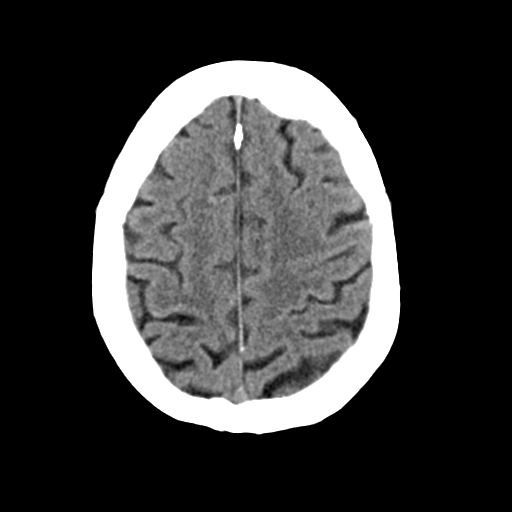
[im 24/32  bone]
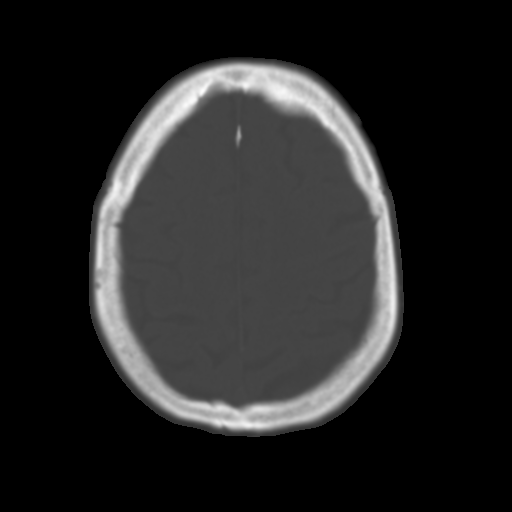
[im 26/32  brain]
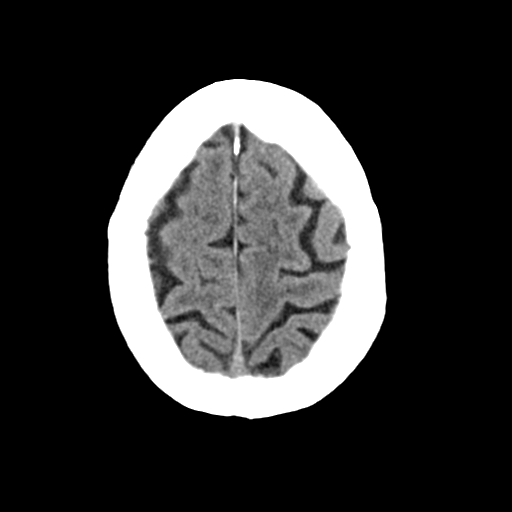
[im 28/32  brain]
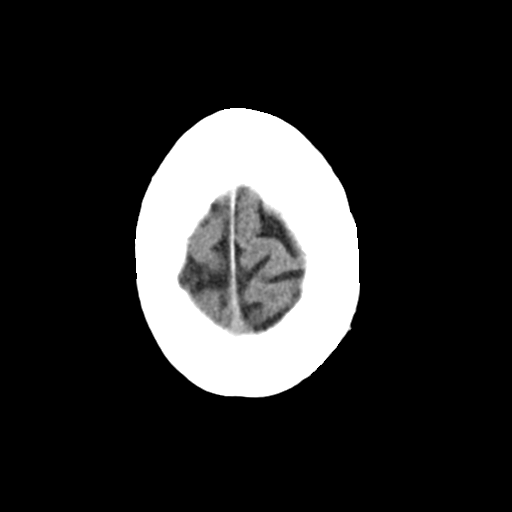
[im 30/32  brain]
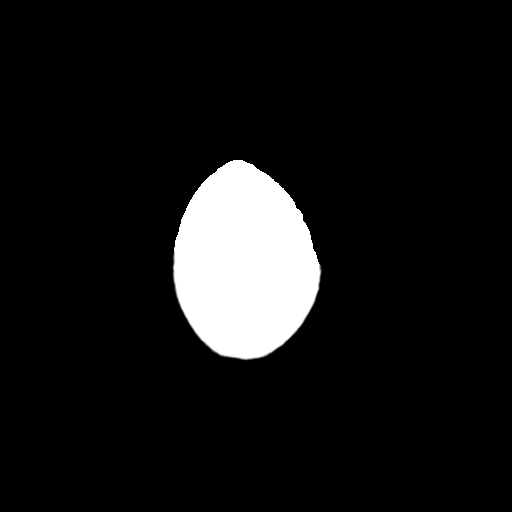

[16 of 30 positions shown; findings below may reference images not displayed]

FINDINGS: Brain: Generalized atrophy. Normal ventricular morphology. No
midline shift or mass effect. Small vessel chronic ischemic changes
of deep cerebral white matter. Probable small old lacunar infarct at
LEFT basal ganglia. No intracranial hemorrhage, mass lesion,
evidence of acute infarction, or extra-axial fluid collection.

Vascular: No hyperdense vessels.

Skull: Demineralized but intact

Sinuses/Orbits: Clear

Other: N/A
IMPRESSION: Atrophy with small vessel chronic ischemic changes of deep cerebral
white matter.

Probable small old lacunar infarct LEFT basal ganglia.

No acute intracranial abnormalities.

## 2018-08-03 DIAGNOSIS — H401112 Primary open-angle glaucoma, right eye, moderate stage: Secondary | ICD-10-CM | POA: Diagnosis not present

## 2018-08-03 DIAGNOSIS — H401121 Primary open-angle glaucoma, left eye, mild stage: Secondary | ICD-10-CM | POA: Diagnosis not present

## 2018-10-08 DIAGNOSIS — H401121 Primary open-angle glaucoma, left eye, mild stage: Secondary | ICD-10-CM | POA: Diagnosis not present

## 2018-10-08 DIAGNOSIS — H401112 Primary open-angle glaucoma, right eye, moderate stage: Secondary | ICD-10-CM | POA: Diagnosis not present

## 2018-12-10 DIAGNOSIS — H401112 Primary open-angle glaucoma, right eye, moderate stage: Secondary | ICD-10-CM | POA: Diagnosis not present

## 2018-12-10 DIAGNOSIS — H401121 Primary open-angle glaucoma, left eye, mild stage: Secondary | ICD-10-CM | POA: Diagnosis not present

## 2019-03-15 DIAGNOSIS — H401121 Primary open-angle glaucoma, left eye, mild stage: Secondary | ICD-10-CM | POA: Diagnosis not present

## 2019-03-15 DIAGNOSIS — H401112 Primary open-angle glaucoma, right eye, moderate stage: Secondary | ICD-10-CM | POA: Diagnosis not present

## 2019-04-07 DIAGNOSIS — G479 Sleep disorder, unspecified: Secondary | ICD-10-CM | POA: Diagnosis not present

## 2019-04-07 DIAGNOSIS — F411 Generalized anxiety disorder: Secondary | ICD-10-CM | POA: Diagnosis not present

## 2019-04-07 DIAGNOSIS — Z85038 Personal history of other malignant neoplasm of large intestine: Secondary | ICD-10-CM | POA: Diagnosis not present

## 2019-04-07 DIAGNOSIS — H409 Unspecified glaucoma: Secondary | ICD-10-CM | POA: Diagnosis not present

## 2019-04-07 DIAGNOSIS — I700 Atherosclerosis of aorta: Secondary | ICD-10-CM | POA: Diagnosis not present

## 2019-04-07 DIAGNOSIS — Z853 Personal history of malignant neoplasm of breast: Secondary | ICD-10-CM | POA: Diagnosis not present

## 2019-04-07 DIAGNOSIS — I10 Essential (primary) hypertension: Secondary | ICD-10-CM | POA: Diagnosis not present

## 2019-04-07 DIAGNOSIS — E7800 Pure hypercholesterolemia, unspecified: Secondary | ICD-10-CM | POA: Diagnosis not present

## 2019-04-07 DIAGNOSIS — R42 Dizziness and giddiness: Secondary | ICD-10-CM | POA: Diagnosis not present

## 2019-04-09 DIAGNOSIS — E7800 Pure hypercholesterolemia, unspecified: Secondary | ICD-10-CM | POA: Diagnosis not present

## 2019-04-09 DIAGNOSIS — I10 Essential (primary) hypertension: Secondary | ICD-10-CM | POA: Diagnosis not present

## 2019-04-09 DIAGNOSIS — R42 Dizziness and giddiness: Secondary | ICD-10-CM | POA: Diagnosis not present

## 2019-06-03 DIAGNOSIS — H401112 Primary open-angle glaucoma, right eye, moderate stage: Secondary | ICD-10-CM | POA: Diagnosis not present

## 2019-06-03 DIAGNOSIS — H401121 Primary open-angle glaucoma, left eye, mild stage: Secondary | ICD-10-CM | POA: Diagnosis not present

## 2019-06-09 DIAGNOSIS — R42 Dizziness and giddiness: Secondary | ICD-10-CM | POA: Diagnosis not present

## 2019-06-10 DIAGNOSIS — R42 Dizziness and giddiness: Secondary | ICD-10-CM | POA: Diagnosis not present

## 2019-06-21 DIAGNOSIS — H401112 Primary open-angle glaucoma, right eye, moderate stage: Secondary | ICD-10-CM | POA: Diagnosis not present

## 2019-06-21 DIAGNOSIS — H401121 Primary open-angle glaucoma, left eye, mild stage: Secondary | ICD-10-CM | POA: Diagnosis not present

## 2019-07-05 ENCOUNTER — Encounter: Payer: Self-pay | Admitting: Neurology

## 2019-08-01 NOTE — Progress Notes (Signed)
NEUROLOGY CONSULTATION NOTE  Morgan Mullins MRN: [id number] DOB: 12-13-1935  Referring provider: Gaynelle Arabian, MD Primary care provider: Gaynelle Arabian, MD  Reason for consult:  dizziness  HISTORY OF PRESENT ILLNESS: Morgan Mullins is an 83 year old female who presents for dizziness.  History supplemented by referring provider note.  She reports dizziness off and on at least since 2019.  She describes it as "swimmy headed".  It's not specifically a spinning sensation or sensation that she is going to pass out.  She feels unsteady on her feet.  No falls.  No double vision, headache, nausea, slurred speech. She is asymptomatic when laying in bed.  She may feel a little lightheaded when sitting up but symptoms most prominent when standing and walking.  It is a fairly constant feeling.    Blood pressure has been stable.  Labs have not revealed anemia or electrolyte abnormalities.  CT head without contrast from 02/03/2018 was personally reviewed and showed atrophy and chronic small vessel ischemic changes with remote lacunar infarct in left basal ganglia but no acute intracranial abnormalities.  Blood pressure is very elevated today.  She says she took her blood pressure medication this morning.  PAST MEDICAL HISTORY: Past Medical History:  Diagnosis Date  . Breast cancer, right breast ([facility]) 1/92   S/P mastectomy  . Cancer of left breast ([facility]) 09/2016   S/P mastectomy 11/13/2016  . Colon cancer ([facility]) 2001   S/P "took 11 inches of my colon out"  . Colon polyps   . DJD (degenerative joint disease)    arthritis in neck  . Glaucoma   . Hyperlipidemia   . Hypertension   . Infertility   . Osteopenia   . PONV (postoperative nausea and vomiting)     PAST SURGICAL HISTORY: Past Surgical History:  Procedure Laterality Date  . BREAST BIOPSY Right 1992  . BREAST BIOPSY Left 09/2016  . CATARACT EXTRACTION W/ INTRAOCULAR LENS  IMPLANT, BILATERAL Bilateral 2002-2003   left 10/02, right 2003   . CHOLECYSTECTOMY OPEN  04/1984  . COLECTOMY  02/20/2000   h/o colon resection secondary to villous adenoma  . MASTECTOMY Right 09/1990  . MASTECTOMY COMPLETE / SIMPLE W/ SENTINEL NODE BIOPSY Left 11/11/2016   axillary  . MASTECTOMY W/ SENTINEL NODE BIOPSY Left 11/11/2016   Procedure: MASTECTOMY WITH AXILLARY SENTINEL LYMPH NODE BIOPSY;  Surgeon: Alphonsa Overall, MD;  Location: [facility];  Service: General;  Laterality: Left;  . TUBAL LIGATION      MEDICATIONS: Current Outpatient Medications on File Prior to Visit  Medication Sig Dispense Refill  . ALPRAZolam (XANAX) 0.25 MG tablet Take 1 tablet (0.25 mg total) by mouth at bedtime as needed for anxiety. 30 tablet 1  . brimonidine (ALPHAGAN) 0.2 % ophthalmic solution daily.    Marland Kitchen CALCIUM-VITAMIN D PO Take 600 mg by mouth daily.     . cholecalciferol (VITAMIN D) 1000 units tablet Take 1,000 Units by mouth daily.      . citalopram (CELEXA) 20 MG tablet Take 1 tablet (20 mg total) by mouth daily. 90 tablet 4  . dorzolamide (TRUSOPT) 2 % ophthalmic solution daily.  11  . latanoprost (XALATAN) 0.005 % ophthalmic solution daily.    Marland Kitchen lisinopril-hydrochlorothiazide (PRINZIDE,ZESTORETIC) 20-12.5 MG per tablet Take 1 tablet by mouth daily.    . simvastatin (ZOCOR) 40 MG tablet Take 40 mg by mouth every morning.     . zolpidem (AMBIEN) 10 MG tablet 10 mg at bedtime as needed for sleep.   0  No current facility-administered medications on file prior to visit.     ALLERGIES: Allergies  Allergen Reactions  . No Known Allergies     FAMILY HISTORY: Family History  Problem Relation Age of Onset  . Cancer Mother        ovarian  . Cancer Father        colon  . Cancer Paternal Aunt        breast    SOCIAL HISTORY: Social History   Socioeconomic History  . Marital status: Married    Spouse name: Not on file  . Number of children: Not on file  . Years of education: Not on file  . Highest education level: Not on file  Occupational History  .  Not on file  Social Needs  . Financial resource strain: Not on file  . Food insecurity    Worry: Not on file    Inability: Not on file  . Transportation needs    Medical: Not on file    Non-medical: Not on file  Tobacco Use  . Smoking status: Never Smoker  . Smokeless tobacco: Never Used  Substance and Sexual Activity  . Alcohol use: No  . Drug use: No  . Sexual activity: Not Currently    Partners: Male    Birth control/protection: Post-menopausal  Lifestyle  . Physical activity    Days per week: Not on file    Minutes per session: Not on file  . Stress: Not on file  Relationships  . Social Herbalist on phone: Not on file    Gets together: Not on file    Attends religious service: Not on file    Active member of club or organization: Not on file    Attends meetings of clubs or organizations: Not on file    Relationship status: Not on file  . Intimate partner violence    Fear of current or ex partner: Not on file    Emotionally abused: Not on file    Physically abused: Not on file    Forced sexual activity: Not on file  Other Topics Concern  . Not on file  Social History Narrative  . Not on file    REVIEW OF SYSTEMS: Constitutional: No fevers, chills, or sweats, no generalized fatigue, change in appetite Eyes: glaucoma Ear, nose and throat: No hearing loss, ear pain, nasal congestion, sore throat Cardiovascular: No chest pain, palpitations Respiratory:  No shortness of breath at rest or with exertion, wheezes GastrointestinaI: No nausea, vomiting, diarrhea, abdominal pain, fecal incontinence Genitourinary:  No dysuria, urinary retention or frequency Musculoskeletal:  No neck pain, back pain Integumentary: No rash, pruritus, skin lesions Neurological: as above Psychiatric: No depression, insomnia, anxiety Endocrine: No palpitations, fatigue, diaphoresis, mood swings, change in appetite, change in weight, increased thirst Hematologic/Lymphatic:  No  purpura, petechiae. Allergic/Immunologic: no itchy/runny eyes, nasal congestion, recent allergic reactions, rashes  PHYSICAL EXAM: Blood pressure (!) 199/100, pulse (!) 54, height 5\' 6"  (1.676 m), weight 130 lb 6.4 oz (59.1 kg), last menstrual period 09/17/1987, SpO2 98 %. General: No acute distress.  Patient appears well-groomed.   Head:  Normocephalic/atraumatic Eyes:  fundi examined but not visualized Neck: supple, no paraspinal tenderness, full range of motion Back: No paraspinal tenderness Heart: regular rate and rhythm Lungs: Clear to auscultation bilaterally. Vascular: No carotid bruits. Neurological Exam: Mental status: alert and oriented to person, place, and time, recent and remote memory intact, fund of knowledge intact, attention and concentration intact, speech  fluent and not dysarthric, language intact. Cranial nerves: CN I: not tested CN II: pupils equal, round and reactive to light, visual fields intact CN III, IV, VI:  full range of motion, no nystagmus, no ptosis CN V: facial sensation intact CN VII: upper and lower face symmetric CN VIII: hearing intact CN IX, X: gag intact, uvula midline CN XI: sternocleidomastoid and trapezius muscles intact CN XII: tongue midline Bulk & Tone: normal, no fasciculations. Motor:  5/5 throughout  Sensation:  temperature and vibration sensation intact. Deep Tendon Reflexes:  2+ throughout, toes downgoing.   Finger to nose testing:  Without dysmetria.   Heel to shin:  Without dysmetria.   Gait:  Mildly uns  IMPRESSION: 1.  Dizziness.  Vague symptomatology.  Causing unsteady gait.   2.  Elevated blood pressure reading  PLAN: 1.  Given her persistent dizziness, unsteady gait and history of breast cancer, will check MRI of brain with and without contrast 2.  Advised to go downstairs to Dr. Andrew Au office for evaluation of very elevated blood pressure.  Thank you for allowing me to take part in the care of this patient.  Metta Clines, DO  CC:  Gaynelle Arabian, MD

## 2019-08-02 ENCOUNTER — Ambulatory Visit (INDEPENDENT_AMBULATORY_CARE_PROVIDER_SITE_OTHER): Payer: Medicare Other | Admitting: Neurology

## 2019-08-02 ENCOUNTER — Other Ambulatory Visit: Payer: Self-pay

## 2019-08-02 ENCOUNTER — Encounter: Payer: Self-pay | Admitting: Neurology

## 2019-08-02 VITALS — BP 199/100 | HR 54 | Ht 66.0 in | Wt 130.4 lb

## 2019-08-02 DIAGNOSIS — R42 Dizziness and giddiness: Secondary | ICD-10-CM | POA: Diagnosis not present

## 2019-08-02 DIAGNOSIS — R030 Elevated blood-pressure reading, without diagnosis of hypertension: Secondary | ICD-10-CM | POA: Diagnosis not present

## 2019-08-02 DIAGNOSIS — R2681 Unsteadiness on feet: Secondary | ICD-10-CM | POA: Diagnosis not present

## 2019-08-02 DIAGNOSIS — Z853 Personal history of malignant neoplasm of breast: Secondary | ICD-10-CM

## 2019-08-02 NOTE — Patient Instructions (Addendum)
1.  Will check MRI of brain with and without contrast 2.  I want you to go down to Dr. Andrew Au office for evaluation of your very high blood pressure 3.  Further recommendations pending MRI results.  A referral to [facility] has been placed for your MRI someone will contact you directly to schedule your appt. They are located at [place]. Please contact them directly by calling [phone] with any questions regarding your referral.

## 2019-08-27 ENCOUNTER — Telehealth: Payer: Self-pay | Admitting: Neurology

## 2019-08-27 ENCOUNTER — Other Ambulatory Visit: Payer: Self-pay | Admitting: Neurology

## 2019-08-27 MED ORDER — DIAZEPAM 5 MG PO TABS: ORAL_TABLET | ORAL | 0 refills | Status: DC

## 2019-08-27 NOTE — Telephone Encounter (Signed)
Left message informing patient valium rx was sent to pharmacy and that she needs a driver for imaging.

## 2019-08-27 NOTE — Telephone Encounter (Signed)
Patient left message with after hours service needing to have a medication called in for her to be able to take her MRI on Monday. She uses [facility] in [place]. She would like you to please call her let her know you called it in for her. Thank you

## 2019-08-27 NOTE — Telephone Encounter (Signed)
Had you discussed sending in anything for patient prior to MRI?

## 2019-08-27 NOTE — Telephone Encounter (Signed)
We did not discuss that.  But I sent in a prescription for diazepam to take 30 to 40 minutes prior to MRI.  She will need a driver to and from the facility.

## 2019-08-30 ENCOUNTER — Other Ambulatory Visit: Payer: Self-pay

## 2019-08-30 ENCOUNTER — Ambulatory Visit
Admission: RE | Admit: 2019-08-30 | Discharge: 2019-08-30 | Disposition: A | Discharge status: Home / Self Care | Payer: Medicare Other | Source: Ambulatory Visit | Attending: Neurology | Admitting: Neurology

## 2019-08-30 DIAGNOSIS — R030 Elevated blood-pressure reading, without diagnosis of hypertension: Secondary | ICD-10-CM

## 2019-08-30 DIAGNOSIS — R2681 Unsteadiness on feet: Secondary | ICD-10-CM

## 2019-08-30 DIAGNOSIS — R42 Dizziness and giddiness: Secondary | ICD-10-CM

## 2019-08-30 DIAGNOSIS — Z853 Personal history of malignant neoplasm of breast: Secondary | ICD-10-CM

## 2019-08-30 MED ORDER — GADOBENATE DIMEGLUMINE 529 MG/ML IV SOLN
12.0000 mL | Freq: Once | INTRAVENOUS | Status: AC | PRN
  Administered 2019-08-30: 12 mL via INTRAVENOUS

## 2019-08-31 ENCOUNTER — Telehealth: Payer: Self-pay

## 2019-08-31 DIAGNOSIS — I781 Nevus, non-neoplastic: Secondary | ICD-10-CM

## 2019-08-31 NOTE — Telephone Encounter (Signed)
Order for three months placed to call reminder for MRI at [facility].

## 2019-08-31 NOTE — Telephone Encounter (Signed)
-----   Message from Pieter Partridge, DO sent at 08/31/2019  9:12 AM EST ----- I called and spoke with Morgan Mullins regarding the MRI results.  No specific cause for the dizziness.  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  However, I would like to repeat MRI of brain with and without contrast in 3 months to follow up and check for any change.

## 2019-09-28 NOTE — Progress Notes (Signed)
84 y.o. G0P0000 Married White or Caucasian female here for annual exam.  Doing well.  Has covid vaccine scheduled for next week.  Denies vaginal bleeding.  Married 63+ years.  Has glaucoma in left eye and is seen every three months.    Had  Patient's last menstrual period was 09/17/1987.          Sexually active: No.  The current method of family planning is post menopausal status.    Exercising: Yes.    walking Smoker:  no  Health Maintenance: Pap:  03/21/17 Neg      History of abnormal Pap:  no MMG:  1/25/18US BIRADS:5 suspicious-Bilateral mastectomy. Colonoscopy:  Cologuard was negative in 2018 BMD:   2011 TDaP:  2012 Pneumonia vaccine(s):  done Shingrix:  none Screening Labs: PCP   reports that she has never smoked. She has never used smokeless tobacco. She reports that she does not drink alcohol or use drugs.  Past Medical History:  Diagnosis Date  . Breast cancer, right breast ([place]) 1/92   S/P mastectomy  . Cancer of left breast ([place]) 09/2016   S/P mastectomy 11/13/2016  . Colon cancer ([place]) 2001   S/P "took 11 inches of my colon out"  . Colon polyps   . DJD (degenerative joint disease)    arthritis in neck  . Glaucoma   . Hyperlipidemia   . Hypertension   . Infertility   . Osteopenia   . PONV (postoperative nausea and vomiting)     Past Surgical History:  Procedure Laterality Date  . BREAST BIOPSY Right 1992  . BREAST BIOPSY Left 09/2016  . CATARACT EXTRACTION W/ INTRAOCULAR LENS  IMPLANT, BILATERAL Bilateral 2002-2003   left 10/02, right 2003  . CHOLECYSTECTOMY OPEN  04/1984  . COLECTOMY  02/20/2000   h/o colon resection secondary to villous adenoma  . MASTECTOMY Right 09/1990  . MASTECTOMY COMPLETE / SIMPLE W/ SENTINEL NODE BIOPSY Left 11/11/2016   axillary  . MASTECTOMY W/ SENTINEL NODE BIOPSY Left 11/11/2016   Procedure: MASTECTOMY WITH AXILLARY SENTINEL LYMPH NODE BIOPSY;  Surgeon: Alphonsa Overall, MD;  Location: [facility];  Service: General;  Laterality: Left;   . TUBAL LIGATION      Current Outpatient Medications  Medication Sig Dispense Refill  . ALPRAZolam (XANAX) 0.25 MG tablet Take 1 tablet (0.25 mg total) by mouth at bedtime as needed for anxiety. 30 tablet 1  . brimonidine (ALPHAGAN) 0.2 % ophthalmic solution daily.    . citalopram (CELEXA) 20 MG tablet Take 1 tablet (20 mg total) by mouth daily. 90 tablet 4  . dorzolamide (TRUSOPT) 2 % ophthalmic solution daily.  11  . dorzolamide-timolol (COSOPT) 22.3-6.8 MG/ML ophthalmic solution 1 drop 2 (two) times daily.    Marland Kitchen latanoprost (XALATAN) 0.005 % ophthalmic solution daily.    Marland Kitchen lisinopril-hydrochlorothiazide (PRINZIDE,ZESTORETIC) 20-12.5 MG per tablet Take 1 tablet by mouth daily.    [name] (RHOPRESSA) 0.02 % SOLN Place 1 drop into the left eye nightly.    . simvastatin (ZOCOR) 40 MG tablet Take 40 mg by mouth every morning.     . zolpidem (AMBIEN) 10 MG tablet 10 mg at bedtime as needed for sleep.   0   No current facility-administered medications for this visit.    Family History  Problem Relation Age of Onset  . Cancer Mother        ovarian  . Cancer Father        colon  . Cancer Paternal Aunt  breast    Review of Systems  All other systems reviewed and are negative.   Exam:   BP 116/80 (BP Location: Right Arm, Patient Position: Sitting, Cuff Size: Normal)   Pulse 60   Temp (!) 97.3 F (36.3 C) (Skin)   Resp 12   Ht 5' 4.25" (1.632 m)   Wt 128 lb 4 oz (58.2 kg)   LMP 09/17/1987   BMI 21.84 kg/m    Height: 5' 4.25" (163.2 cm)  Ht Readings from Last 3 Encounters:  10/01/19 5' 4.25" (1.632 m)  08/02/19 5\' 6"  (1.676 m)  06/02/18 5' 4.75" (1.645 m)    General appearance: alert, cooperative and appears stated age Head: Normocephalic, without obvious abnormality, atraumatic Neck: no adenopathy, supple, symmetrical, trachea midline and thyroid normal to inspection and palpation Lungs: clear to auscultation bilaterally Breasts: bilateral  mastectomy, no masses, no LAD Heart: regular rate and rhythm Abdomen: soft, non-tender; bowel sounds normal; no masses,  no organomegaly Extremities: extremities normal, atraumatic, no cyanosis or edema Skin: Skin color, texture, turgor normal. No rashes or lesions Lymph nodes: Cervical, supraclavicular, and axillary nodes normal. No abnormal inguinal nodes palpated Neurologic: Grossly normal   Pelvic: External genitalia:  no lesions              Urethra:  normal appearing urethra with no masses, tenderness or lesions              Bartholins and Skenes: normal                 Vagina: normal appearing vagina with normal color and discharge, no lesions              Cervix: no lesions              Pap taken: No. Bimanual Exam:  Uterus:  normal size, contour, position, consistency, mobility, non-tender              Adnexa: normal adnexa and no mass, fullness, tenderness               Rectovaginal: Confirms               Anus:  normal sphincter tone, no lesions  Chaperone, Karmen Bongo, RN, was present for exam.  A:  Well Woman with normal exam PMP, no HRT H/o right breast cancer s/p mastectomy 1992, then invasive lobular 2/18 with neg LND.  No additional treatment recommended.  Is followed by Dr. Lucia Gaskins yearly. Hypertension H/O elevated lipids Left eye glaucoma Osteopenia H/o villous adenoma with resection of portion of colon in 2001  P:   Mammogram not indicated.  S/p bilateral mastectomy. pap smear not indicated RF for Celexa 20mg  daily to pharmacy.  #90/4RF Declines shingrix vaccination Does not need future colonoscopy screening Return annually or prn

## 2019-09-29 ENCOUNTER — Other Ambulatory Visit: Payer: Self-pay

## 2019-10-01 ENCOUNTER — Ambulatory Visit (INDEPENDENT_AMBULATORY_CARE_PROVIDER_SITE_OTHER): Payer: Medicare Other | Admitting: Obstetrics & Gynecology

## 2019-10-01 ENCOUNTER — Other Ambulatory Visit: Payer: Self-pay

## 2019-10-01 ENCOUNTER — Encounter: Payer: Self-pay | Admitting: Obstetrics & Gynecology

## 2019-10-01 VITALS — BP 116/80 | HR 60 | Temp 97.3°F | Resp 12 | Ht 64.25 in | Wt 128.2 lb

## 2019-10-01 DIAGNOSIS — Z124 Encounter for screening for malignant neoplasm of cervix: Secondary | ICD-10-CM

## 2019-10-01 DIAGNOSIS — Z01419 Encounter for gynecological examination (general) (routine) without abnormal findings: Secondary | ICD-10-CM

## 2019-10-07 ENCOUNTER — Ambulatory Visit: Payer: Medicare Other | Attending: Internal Medicine

## 2019-10-07 DIAGNOSIS — Z23 Encounter for immunization: Secondary | ICD-10-CM

## 2019-10-07 NOTE — Progress Notes (Signed)
   Covid-19 Vaccination Clinic  Name:  Morgan Mullins    MRN: [id number] DOB: 01/23/1936  10/07/2019  Morgan Mullins was observed post Covid-19 immunization for 15 minutes without incidence. She was provided with Vaccine Information Sheet and instruction to access the V-Safe system.   Morgan Mullins was instructed to call 911 with any severe reactions post vaccine: Marland Kitchen Difficulty breathing  . Swelling of your face and throat  . A fast heartbeat  . A bad rash all over your body  . Dizziness and weakness    Immunizations Administered    Name Date Dose VIS Date Route   Pfizer COVID-19 Vaccine 10/07/2019  9:13 AM 0.3 mL 08/27/2019 Intramuscular   Manufacturer: [place]   Lot: [id number]   [place]: [id number]

## 2019-10-13 DIAGNOSIS — Z85038 Personal history of other malignant neoplasm of large intestine: Secondary | ICD-10-CM | POA: Diagnosis not present

## 2019-10-13 DIAGNOSIS — Z1389 Encounter for screening for other disorder: Secondary | ICD-10-CM | POA: Diagnosis not present

## 2019-10-13 DIAGNOSIS — I700 Atherosclerosis of aorta: Secondary | ICD-10-CM | POA: Diagnosis not present

## 2019-10-13 DIAGNOSIS — N183 Chronic kidney disease, stage 3 unspecified: Secondary | ICD-10-CM | POA: Diagnosis not present

## 2019-10-13 DIAGNOSIS — E7800 Pure hypercholesterolemia, unspecified: Secondary | ICD-10-CM | POA: Diagnosis not present

## 2019-10-13 DIAGNOSIS — H409 Unspecified glaucoma: Secondary | ICD-10-CM | POA: Diagnosis not present

## 2019-10-13 DIAGNOSIS — Z0000 Encounter for general adult medical examination without abnormal findings: Secondary | ICD-10-CM | POA: Diagnosis not present

## 2019-10-13 DIAGNOSIS — Z853 Personal history of malignant neoplasm of breast: Secondary | ICD-10-CM | POA: Diagnosis not present

## 2019-10-13 DIAGNOSIS — F411 Generalized anxiety disorder: Secondary | ICD-10-CM | POA: Diagnosis not present

## 2019-10-13 DIAGNOSIS — G479 Sleep disorder, unspecified: Secondary | ICD-10-CM | POA: Diagnosis not present

## 2019-10-13 DIAGNOSIS — I10 Essential (primary) hypertension: Secondary | ICD-10-CM | POA: Diagnosis not present

## 2019-10-28 ENCOUNTER — Ambulatory Visit: Payer: Medicare Other | Attending: Internal Medicine

## 2019-10-28 DIAGNOSIS — Z23 Encounter for immunization: Secondary | ICD-10-CM | POA: Insufficient documentation

## 2019-10-28 NOTE — Progress Notes (Signed)
   Covid-19 Vaccination Clinic  Name:  Kryslyn Bitting    MRN: [id number] DOB: [birth year]  10/28/2019  Ms. Nauta was observed post Covid-19 immunization for 15 minutes without incidence. She was provided with Vaccine Information Sheet and instruction to access the V-Safe system.   Ms. Rients was instructed to call 911 with any severe reactions post vaccine: Marland Kitchen Difficulty breathing  . Swelling of your face and throat  . A fast heartbeat  . A bad rash all over your body  . Dizziness and weakness    Immunizations Administered    Name Date Dose VIS Date Route   Pfizer COVID-19 Vaccine 10/28/2019  9:32 AM 0.3 mL 08/27/2019 Intramuscular   Manufacturer: [place]   Lot: [id number]   [place]: [id number]

## 2019-12-09 DIAGNOSIS — H401112 Primary open-angle glaucoma, right eye, moderate stage: Secondary | ICD-10-CM | POA: Diagnosis not present

## 2019-12-09 DIAGNOSIS — H401121 Primary open-angle glaucoma, left eye, mild stage: Secondary | ICD-10-CM | POA: Diagnosis not present

## 2020-04-01 DIAGNOSIS — H401112 Primary open-angle glaucoma, right eye, moderate stage: Secondary | ICD-10-CM | POA: Diagnosis not present

## 2020-04-01 DIAGNOSIS — H401121 Primary open-angle glaucoma, left eye, mild stage: Secondary | ICD-10-CM | POA: Diagnosis not present

## 2020-05-04 ENCOUNTER — Encounter: Payer: Self-pay | Admitting: Genetic Counselor

## 2020-05-31 DIAGNOSIS — N183 Chronic kidney disease, stage 3 unspecified: Secondary | ICD-10-CM | POA: Diagnosis not present

## 2020-05-31 DIAGNOSIS — I10 Essential (primary) hypertension: Secondary | ICD-10-CM | POA: Diagnosis not present

## 2020-05-31 DIAGNOSIS — Z853 Personal history of malignant neoplasm of breast: Secondary | ICD-10-CM | POA: Diagnosis not present

## 2020-05-31 DIAGNOSIS — Z85038 Personal history of other malignant neoplasm of large intestine: Secondary | ICD-10-CM | POA: Diagnosis not present

## 2020-05-31 DIAGNOSIS — E7800 Pure hypercholesterolemia, unspecified: Secondary | ICD-10-CM | POA: Diagnosis not present

## 2020-05-31 DIAGNOSIS — I700 Atherosclerosis of aorta: Secondary | ICD-10-CM | POA: Diagnosis not present

## 2020-05-31 DIAGNOSIS — G479 Sleep disorder, unspecified: Secondary | ICD-10-CM | POA: Diagnosis not present

## 2020-05-31 DIAGNOSIS — F411 Generalized anxiety disorder: Secondary | ICD-10-CM | POA: Diagnosis not present

## 2020-05-31 DIAGNOSIS — H409 Unspecified glaucoma: Secondary | ICD-10-CM | POA: Diagnosis not present

## 2020-05-31 DIAGNOSIS — Z23 Encounter for immunization: Secondary | ICD-10-CM | POA: Diagnosis not present

## 2020-05-31 DIAGNOSIS — R42 Dizziness and giddiness: Secondary | ICD-10-CM | POA: Diagnosis not present

## 2020-07-05 DIAGNOSIS — Z23 Encounter for immunization: Secondary | ICD-10-CM | POA: Diagnosis not present

## 2020-07-21 DIAGNOSIS — H401121 Primary open-angle glaucoma, left eye, mild stage: Secondary | ICD-10-CM | POA: Diagnosis not present

## 2020-07-21 DIAGNOSIS — H401112 Primary open-angle glaucoma, right eye, moderate stage: Secondary | ICD-10-CM | POA: Diagnosis not present

## 2020-07-31 DIAGNOSIS — H401112 Primary open-angle glaucoma, right eye, moderate stage: Secondary | ICD-10-CM | POA: Diagnosis not present

## 2020-07-31 DIAGNOSIS — H401121 Primary open-angle glaucoma, left eye, mild stage: Secondary | ICD-10-CM | POA: Diagnosis not present

## 2020-10-16 NOTE — Progress Notes (Signed)
Cardiology Office Note:    Date:  10/19/2020   ID:  Morgan Mullins, DOB 1935/11/30, MRN [id number]  PCP:  Gaynelle Arabian, MD  [facility] Cardiologist:  Freada Bergeron, MD  [facility] Electrophysiologist:  None   Referring MD: Jonathon Jordan, MD     History of Present Illness:    Morgan Mullins is a 85 y.o. female with a hx of breast cancer s/p mastectomy, colon cancer s/p partial colectomy, HTN, HLD, and osteopenia who was referred to clinic by Dr. Stephanie Acre for concern for symptomatic bradycardia.   The patient states that she has been feeling dizzy for the past year. This has been constant. No loss of consciousness. No chest pain or SOB. Has occasional LE edema. No orthopnea or PND. No palpitations. Resting HR in 40s but she is unsure if it augments with exercise. Not on nodal agents.  Blood pressure has eben well controlled in 110-130s. HR persistently 40-60s. Feels dizzy with exertion, but not any different than at rest.  Past Medical History:  Diagnosis Date  . Breast cancer, right breast ([place]) 1/92   S/P mastectomy  . Cancer of left breast ([place]) 09/2016   S/P mastectomy 11/13/2016  . Colon cancer ([place]) 2001   S/P "took 11 inches of my colon out"  . Colon polyps   . DJD (degenerative joint disease)    arthritis in neck  . Glaucoma   . Hyperlipidemia   . Hypertension   . Infertility   . Osteopenia   . PONV (postoperative nausea and vomiting)     Past Surgical History:  Procedure Laterality Date  . BREAST BIOPSY Right 1992  . BREAST BIOPSY Left 09/2016  . CATARACT EXTRACTION W/ INTRAOCULAR LENS  IMPLANT, BILATERAL Bilateral 2002-2003   left 10/02, right 2003  . CHOLECYSTECTOMY OPEN  04/1984  . COLECTOMY  02/20/2000   h/o colon resection secondary to villous adenoma  . MASTECTOMY Right 09/1990  . MASTECTOMY COMPLETE / SIMPLE W/ SENTINEL NODE BIOPSY Left 11/11/2016   axillary  . MASTECTOMY W/ SENTINEL NODE BIOPSY Left 11/11/2016   Procedure: MASTECTOMY WITH  AXILLARY SENTINEL LYMPH NODE BIOPSY;  Surgeon: Alphonsa Overall, MD;  Location: [facility];  Service: General;  Laterality: Left;  . TUBAL LIGATION      Current Medications: Current Meds  Medication Sig  . ALPRAZolam (XANAX) 0.25 MG tablet Take 1 tablet (0.25 mg total) by mouth at bedtime as needed for anxiety.  . dorzolamide-timolol (COSOPT) 22.3-6.8 MG/ML ophthalmic solution 1 drop 2 (two) times daily.  Marland Kitchen lisinopril-hydrochlorothiazide (PRINZIDE,ZESTORETIC) 20-12.5 MG per tablet Take 1 tablet by mouth daily.  [name] (RHOPRESSA) 0.02 % SOLN Place 1 drop into the left eye nightly.  . simvastatin (ZOCOR) 40 MG tablet Take 40 mg by mouth every morning.     Allergies:   No known allergies   Social History   Socioeconomic History  . Marital status: Married    Spouse name: Not on file  . Number of children: 0  . Years of education: Not on file  . Highest education level: High school graduate  Occupational History  . Not on file  Tobacco Use  . Smoking status: Never Smoker  . Smokeless tobacco: Never Used  Vaping Use  . Vaping Use: Never used  Substance and Sexual Activity  . Alcohol use: No  . Drug use: No  . Sexual activity: Not Currently    Partners: Male    Birth control/protection: Post-menopausal  Other Topics Concern  . Not  on file  Social History Narrative   Married, no children lives with spouse in 1 story home   High school grad   Right handed   Drinks 1 cup of coffee in morning, tea sometimes, no soda   Social Determinants of Health   Financial Resource Strain: Not on file  Food Insecurity: Not on file  Transportation Needs: Not on file  Physical Activity: Not on file  Stress: Not on file  Social Connections: Not on file     Family History: The patient's family history includes Breast cancer in her paternal aunt; Colon cancer in her father; Ovarian cancer in her mother.  ROS:   Please see the history of present illness.    Review of Systems   Constitutional: Positive for malaise/fatigue. Negative for chills and fever.  HENT: Negative for nosebleeds.   Eyes: Negative for blurred vision.  Respiratory: Negative for shortness of breath.   Cardiovascular: Positive for leg swelling. Negative for chest pain, palpitations, orthopnea, claudication and PND.  Gastrointestinal: Negative for nausea and vomiting.  Genitourinary: Negative for dysuria.  Musculoskeletal: Negative for falls.  Neurological: Positive for dizziness. Negative for loss of consciousness.  Psychiatric/Behavioral: Negative for substance abuse.    EKGs/Labs/Other Studies Reviewed:    The following studies were reviewed today: ABI 2013-11-20: Right Lower Extremity: Normal triphasic waveforms throughout the  right lower extremity. No significant focal pressure drop. Normal  pulse volume recordings.   Left Lower Extremity: Normal triphasic waveforms throughout the left  lower extremity. No significant focal pressure drop. Normal pulse  volume recordings.   IMPRESSION:  Normal examination. No evidence of peripheral arterial disease.    MRI head 2020:  IMPRESSION: 1. 5 x 7 mm ill-defined enhancement left insula with evidence of prior hemorrhage. I would favor this is a benign lesion such as a cavernoma or capillary telangiectasia however given history of breast cancer, follow-up MRI suggested in 2-3 months to evaluate for interval change. No other suspicious lesions identified. 2. No acute abnormality. Mild chronic microvascular ischemia in the white matter and pons.   EKG:  EKG is  ordered today.  The ekg ordered today demonstrates sinus bradycardia with 1 degree AVB. HR 44.   Recent Labs: No results found for requested labs within last 8760 hours.  Recent Lipid Panel No results found for: CHOL, TRIG, HDL, CHOLHDL, VLDL, LDLCALC, LDLDIRECT    Physical Exam:    VS:  BP 130/82   Pulse (!) 44   Ht 5' 6"  (1.676 m)   Wt 125 lb 9.6 oz (57 kg)   LMP  09/17/1987   SpO2 97%   BMI 20.27 kg/m     Wt Readings from Last 3 Encounters:  10/19/20 125 lb 9.6 oz (57 kg)  10/01/19 128 lb 4 oz (58.2 kg)  08/02/19 130 lb 6.4 oz (59.1 kg)     GEN:  Well nourished, well developed in no acute distress HEENT: Normal NECK: No JVD; No carotid bruits CARDIAC: Bradycardic, regular, no murmurs, rubs, gallops RESPIRATORY:  Clear to auscultation without rales, wheezing or rhonchi  ABDOMEN: Soft, non-tender, non-distended MUSCULOSKELETAL:  Trace edema, warm SKIN: Warm and dry NEUROLOGIC:  Alert and oriented x 3 PSYCHIATRIC:  Normal affect   ASSESSMENT:    1. Sinus bradycardia   2. Pre-syncope   3. Dizziness   4. Primary hypertension   5. Pure hypercholesterolemia   6. History of breast cancer, right   7. History of colon cancer, stage I, right colon  PLAN:    In order of problems listed above:  #Conncern for Symptomatic Bradycardia: #Dizziness Patient with constant sensation of being lightheaded/dizzy for the past year with concern for symptomatic bradycardia. HR in 40s not on nodal agents. ECG with 1 degree AVB. Based on BP log brought today, HR ranges from low 40-mid 60s at home. Symptoms are constant and do not seem worse with exertion, but she admits she is minimally active due to fatigue. Concerned that symptomatic bradycardia may be leading to current presentation.  -Check 7 day Zio patch -Check exercise treadmill to assess for chronotropic incompetence -Check TTE -Avoid nodal agents -TSH normal -Pending above work-up, can refer to EP if needed  #HTN: Well controlled at home -Continue lisinopril-HCTZ 20-12.72m daily  #HLD: -Continue simva 467mdaily  #History of breast cancer and colon cancer: Doing well post-operatively.  -Follow-up with primary provider   Shared Decision Making/Informed Consent The risks [chest pain, shortness of breath, cardiac arrhythmias, dizziness, blood pressure fluctuations, myocardial infarction,  stroke/transient ischemic attack, and life-threatening complications (estimated to be 1 in 10,000)], benefits (risk stratification, diagnosing coronary artery disease, treatment guidance) and alternatives of an exercise tolerance test were discussed in detail with Ms. MuLupund she agrees to proceed.   Medication Adjustments/Labs and Tests Ordered: Current medicines are reviewed at length with the patient today.  Concerns regarding medicines are outlined above.  Orders Placed This Encounter  Procedures  . LONG TERM MONITOR-LIVE TELEMETRY (3-14 DAYS)  . Exercise Tolerance Test  . EKG 12-Lead  . ECHOCARDIOGRAM COMPLETE   No orders of the defined types were placed in this encounter.   Patient Instructions  Medication Instructions:  Your physician recommends that you continue on your current medications as directed. Please refer to the Current Medication list given to you today.  *If you need a refill on your cardiac medications before your next appointment, please call your pharmacy*   Lab Work: none If you have labs (blood work) drawn today and your tests are completely normal, you will receive your results only by: . Marland KitchenyChart Message (if you have MyChart) OR . A paper copy in the mail If you have any lab test that is abnormal or we need to change your treatment, we will call you to review the results.   Testing/Procedures: Your physician has requested that you have an exercise tolerance test. For further information please visit [email or web address] also follow instruction sheet, as given.  Your physician has requested that you have an echocardiogram. Echocardiography is a painless test that uses sound waves to create images of your heart. It provides your doctor with information about the size and shape of your heart and how well your heart's chambers and valves are working. This procedure takes approximately one hour. There are no restrictions for this procedure.  Your  physician has recommended that you wear an event monitor. Event monitors are medical devices that record the heart's electrical activity. Doctors most often usKoreahese monitors to diagnose arrhythmias. Arrhythmias are problems with the speed or rhythm of the heartbeat. The monitor is a small, portable device. You can wear one while you do your normal daily activities. This is usually used to diagnose what is causing palpitations/syncope (passing out). 7 day zio     Follow-Up: [facility] and your health needs are our priority.  As part of our continuing mission to provide you with exceptional heart care, we have created designated Provider Care Teams.  These Care Teams include your primary  Cardiologist (physician) and Advanced Practice Providers (APPs -  Physician Assistants and Nurse Practitioners) who all work together to provide you with the care you need, when you need it.  We recommend signing up for the patient portal called "MyChart".  Sign up information is provided on this After Visit Summary.  MyChart is used to connect with patients for Virtual Visits (Telemedicine).  Patients are able to view lab/test results, encounter notes, upcoming appointments, etc.  Non-urgent messages can be sent to your provider as well.   To learn more about what you can do with MyChart, go to [email or web address].    Your next appointment:   1 month(s)  The format for your next appointment:   In Person  Provider:   You may see Freada Bergeron, MD or one of the following Advanced Practice Providers on your designated Care Team:    Richardson Dopp, PA-C  [place]in [place], [place]    Other Instructions  ZIO AT Long term monitor-Live Telemetry  Your physician has requested you wear a ZIO patch monitor for _7_ days.  This is a single patch monitor. [employer] supplies one patch monitor per enrollment. Additional stickers are not available.  Please do not apply patch if you will be having a Nuclear  Stress Test, Echocardiogram, Cardiac CT, MRI, or Chest Xray during the time frame you would be wearing the monitor. The patch cannot be worn during these tests. You cannot remove and re-apply the ZIO AT patch monitor.   Your ZIO patch monitor will be sent Fed Ex from [employer] directly to your home address. The monitor may also be mailed to a PO BOX if home delivery is not available. It may take 3-5 days to receive your monitor after you have been enrolled.  Once you have received you monitor, please review enclosed instructions. Your monitor has already been registered assigning a specific monitor serial # to you.   Applying the monitor  Shave hair from upper left chest.  Hold abrader disc by orange tab. Rub abrader in 40 strokes over left upper chest as indicated in your monitor instructions.  Clean area with 4 enclosed alcohol pads. Use all pads to ensure the area is cleaned thoroughly. Let dry.  Apply patch as indicated in monitor instructions. Patch will be placed under collarbone on left side of chest with arrow pointing upward.  Rub patch adhesive wings for 2 minutes. Remove the white label marked "1". Remove the white label marked "2". Rub patch adhesive wings for 2 additional minutes.  While looking in a mirror, press and release button in center of patch. A small green light will flash 3-4 times. This will be your only indicator the monitor has been turned on.  Do not shower for the first 24 hours. You may shower after the first 24 hours.  Press the button if you feel a symptom. You will hear a small click. Record Date, Time and Symptom in the Patient Log.   Starting the Gateway  In your kit there is a Hydrographic surveyor box the size of a cellphone. This is Airline pilot. It transmits all your recorded data to [employer]. This box must stay within 10 feet of you at all times. Open the box and push the * button. There will be a light that blinks orange and then green a few times. When the  light stops blinking, the Gateway is connected to the ZIO patch.  Call [employer] at [phone] to confirm your monitor is transmitting.  Returning your monitor  Remove your patch and place it inside the [place]. In the lower half of the Gateway there is a white bag with prepaid postage on it. Place Gateway in bag and seal. Mail package back to [place] as soon as possible. Your physician should have your final report approximately 7 days after you have mailed back your monitor.   Call [facility] at [phone] if you have questions regarding your ZIO AT patch monitor. Call them immediately if you see an orange light blinking on your monitor.  If your monitor falls off in less than 4 days contact our Monitor department at [phone]. If your monitor becomes loose or falls off after 4 days call [employer] at [phone] for suggestions on securing your monitor.       Signed, Freada Bergeron, MD  10/19/2020 11:10 AM    [place]

## 2020-10-19 ENCOUNTER — Ambulatory Visit (INDEPENDENT_AMBULATORY_CARE_PROVIDER_SITE_OTHER): Payer: Medicare Other

## 2020-10-19 ENCOUNTER — Encounter: Payer: Self-pay | Admitting: *Deleted

## 2020-10-19 ENCOUNTER — Other Ambulatory Visit: Payer: Self-pay

## 2020-10-19 ENCOUNTER — Encounter: Payer: Self-pay | Admitting: Cardiology

## 2020-10-19 ENCOUNTER — Ambulatory Visit (INDEPENDENT_AMBULATORY_CARE_PROVIDER_SITE_OTHER): Payer: Medicare Other | Admitting: Cardiology

## 2020-10-19 VITALS — BP 130/82 | HR 44 | Ht 66.0 in | Wt 125.6 lb

## 2020-10-19 DIAGNOSIS — I10 Essential (primary) hypertension: Secondary | ICD-10-CM

## 2020-10-19 DIAGNOSIS — R55 Syncope and collapse: Secondary | ICD-10-CM

## 2020-10-19 DIAGNOSIS — R42 Dizziness and giddiness: Secondary | ICD-10-CM | POA: Diagnosis not present

## 2020-10-19 DIAGNOSIS — Z853 Personal history of malignant neoplasm of breast: Secondary | ICD-10-CM

## 2020-10-19 DIAGNOSIS — E7800 Pure hypercholesterolemia, unspecified: Secondary | ICD-10-CM

## 2020-10-19 DIAGNOSIS — R001 Bradycardia, unspecified: Secondary | ICD-10-CM | POA: Diagnosis not present

## 2020-10-19 DIAGNOSIS — Z85038 Personal history of other malignant neoplasm of large intestine: Secondary | ICD-10-CM

## 2020-10-19 NOTE — Patient Instructions (Addendum)
Medication Instructions:  Your physician recommends that you continue on your current medications as directed. Please refer to the Current Medication list given to you today.  *If you need a refill on your cardiac medications before your next appointment, please call your pharmacy*   Lab Work: none If you have labs (blood work) drawn today and your tests are completely normal, you will receive your results only by: Marland Kitchen MyChart Message (if you have MyChart) OR . A paper copy in the mail If you have any lab test that is abnormal or we need to change your treatment, we will call you to review the results.   Testing/Procedures: Your physician has requested that you have an exercise tolerance test. For further information please visit [email or web address]. Please also follow instruction sheet, as given.  You will need covid screening prior to test  Your physician has requested that you have an echocardiogram. Echocardiography is a painless test that uses sound waves to create images of your heart. It provides your doctor with information about the size and shape of your heart and how well your heart's chambers and valves are working. This procedure takes approximately one hour. There are no restrictions for this procedure.  Your physician has recommended that you wear an event monitor. Event monitors are medical devices that record the heart's electrical activity. Doctors most often [place] these monitors to diagnose arrhythmias. Arrhythmias are problems with the speed or rhythm of the heartbeat. The monitor is a small, portable device. You can wear one while you do your normal daily activities. This is usually used to diagnose what is causing palpitations/syncope (passing out). 7 day zio     Follow-Up: At [facility], you and your health needs are our priority.  As part of our continuing mission to provide you with exceptional heart care, we have created designated Provider Care Teams.  These Care  Teams include your primary Cardiologist (physician) and Advanced Practice Providers (APPs -  Physician Assistants and Nurse Practitioners) who all work together to provide you with the care you need, when you need it.  We recommend signing up for the patient portal called "MyChart".  Sign up information is provided on this After Visit Summary.  MyChart is used to connect with patients for Virtual Visits (Telemedicine).  Patients are able to view lab/test results, encounter notes, upcoming appointments, etc.  Non-urgent messages can be sent to your provider as well.   To learn more about what you can do with MyChart, go to [email or web address].    Your next appointment:   1 month(s)  The format for your next appointment:   In Person  Provider:   You may see Freada Bergeron, MD or one of the following Advanced Practice Providers on your designated Care Team:    Richardson Dopp, PA-C  Vin [place], [place]    Other Instructions  ZIO AT Long term monitor-Live Telemetry  Your physician has requested you wear a ZIO patch monitor for _7_ days.  This is a single patch monitor. [employer] supplies one patch monitor per enrollment. Additional stickers are not available.  Please do not apply patch if you will be having a Nuclear Stress Test, Echocardiogram, Cardiac CT, MRI, or Chest Xray during the time frame you would be wearing the monitor. The patch cannot be worn during these tests. You cannot remove and re-apply the ZIO AT patch monitor.   Your ZIO patch monitor will be sent Fed Ex from [employer] directly to your home address. The  monitor may also be mailed to a PO BOX if home delivery is not available. It may take 3-5 days to receive your monitor after you have been enrolled.  Once you have received you monitor, please review enclosed instructions. Your monitor has already been registered assigning a specific monitor serial # to you.   Applying the monitor  Shave hair from upper left  chest.  Hold abrader disc by orange tab. Rub abrader in 40 strokes over left upper chest as indicated in your monitor instructions.  Clean area with 4 enclosed alcohol pads. Use all pads to ensure the area is cleaned thoroughly. Let dry.  Apply patch as indicated in monitor instructions. Patch will be placed under collarbone on left side of chest with arrow pointing upward.  Rub patch adhesive wings for 2 minutes. Remove the white label marked "1". Remove the white label marked "2". Rub patch adhesive wings for 2 additional minutes.  While looking in a mirror, press and release button in center of patch. A small green light will flash 3-4 times. This will be your only indicator the monitor has been turned on.  Do not shower for the first 24 hours. You may shower after the first 24 hours.  Press the button if you feel a symptom. You will hear a small click. Record Date, Time and Symptom in the Patient Log.   Starting the Gateway  In your kit there is a Hydrographic surveyor box the size of a cellphone. This is Airline pilot. It transmits all your recorded data to [facility]. This box must stay within 10 feet of you at all times. Open the box and push the * button. There will be a light that blinks orange and then green a few times. When the light stops blinking, the Gateway is connected to the ZIO patch.  Call [employer] at [phone] to confirm your monitor is transmitting.   Returning your monitor  Remove your patch and place it inside the [place]. In the lower half of the Gateway there is a white bag with prepaid postage on it. Place Gateway in bag and seal. Mail package back to [place] as soon as possible. Your physician should have your final report approximately 7 days after you have mailed back your monitor.   Call [place] at [phone] if you have questions regarding your ZIO AT patch monitor. Call them immediately if you see an orange light blinking on your monitor.  If  your monitor falls off in less than 4 days contact our Monitor department at [phone]. If your monitor becomes loose or falls off after 4 days call [employer] at [phone] for suggestions on securing your monitor.   Due to recent COVID-19 restrictions implemented by our local and state authorities and in an effort to keep both patients and staff as safe as possible, our hospital system requires COVID-19 testing prior to certain scheduled hospital procedures.  Please go to [place]. [place], [place] [id number]   This is a drive up testing site.  You will not need to exit your vehicle.  You will not be billed at the time of testing but may receive a bill later depending on your insurance. You must agree to self-quarantine from the time of your testing until the procedure date  This should included staying home with ONLY the people you live with.  Avoid take-out, grocery store shopping or leaving the house for any non-emergent reason.  Failure to have your COVID-19 test done  on the date and time you have been scheduled will result in cancellation of your procedure.  Please call our office at [phone] if you have any questions.

## 2020-10-19 NOTE — Progress Notes (Signed)
Patient ID: Morgan Mullins, female   DOB: [birth year], 85 y.o.   MRN: [id number] Patient enrolled for [employer] to ship a 7 day ZIO AT long term monitor-Live telemetry to her home.

## 2020-10-24 DIAGNOSIS — R001 Bradycardia, unspecified: Secondary | ICD-10-CM

## 2020-10-24 DIAGNOSIS — R55 Syncope and collapse: Secondary | ICD-10-CM | POA: Diagnosis not present

## 2020-11-09 ENCOUNTER — Telehealth: Payer: Self-pay

## 2020-11-09 DIAGNOSIS — R55 Syncope and collapse: Secondary | ICD-10-CM

## 2020-11-09 DIAGNOSIS — R42 Dizziness and giddiness: Secondary | ICD-10-CM

## 2020-11-09 DIAGNOSIS — R001 Bradycardia, unspecified: Secondary | ICD-10-CM

## 2020-11-09 NOTE — Telephone Encounter (Signed)
Per Dr. Paul Dykes like Pt to keep follow up with her.  Attempted to call Pt.  Line was busy.

## 2020-11-09 NOTE — Telephone Encounter (Signed)
Call placed to Pt.  Advised per Dr. Johney Frame, based on HM results would like to refer Pt to EP.  Pt scheduled to see Dr. Rayann Heman on November 23, 2020.

## 2020-11-09 NOTE — Telephone Encounter (Signed)
Only if her exercise stress test has been done. We can post-pone until later in March if not. Thank you for checking

## 2020-11-09 NOTE — Telephone Encounter (Signed)
-----   Message from Freada Bergeron, MD sent at 11/08/2020  8:19 PM EST ----- Can we get her an appointment with EP. I am a little concerned she has symptomatic bradycardia driving her symptoms.

## 2020-11-10 ENCOUNTER — Other Ambulatory Visit (HOSPITAL_COMMUNITY)
Admission: RE | Admit: 2020-11-10 | Discharge: 2020-11-10 | Disposition: A | Discharge status: Home / Self Care | Payer: Medicare Other | Source: Ambulatory Visit | Attending: Cardiology | Admitting: Cardiology

## 2020-11-10 ENCOUNTER — Telehealth: Payer: Self-pay | Admitting: Cardiology

## 2020-11-10 DIAGNOSIS — Z01812 Encounter for preprocedural laboratory examination: Secondary | ICD-10-CM | POA: Diagnosis present

## 2020-11-10 DIAGNOSIS — Z20822 Contact with and (suspected) exposure to covid-19: Secondary | ICD-10-CM | POA: Diagnosis not present

## 2020-11-10 LAB — SARS CORONAVIRUS 2 (TAT 6-24 HRS): SARS Coronavirus 2: NEGATIVE

## 2020-11-10 NOTE — Telephone Encounter (Signed)
Patient has been rescheduled for COVID screening this afternoon.

## 2020-11-10 NOTE — Telephone Encounter (Signed)
  Husband states he took the patient to get her covid testing done for her upcoming procedure and the place had already closed. The husband would like a call back to let him know what they need to do.

## 2020-11-12 NOTE — Progress Notes (Unsigned)
Cardiology Office Note:    Date:  11/16/2020   ID:  Morgan Mullins, DOB Dec 06, 1935, MRN [id number]  PCP:  Morgan Jordan, MD   [facility]  Cardiologist:  Morgan Bergeron, MD  Advanced Practice Provider:  No care team member to display Electrophysiologist:  None   Referring MD: Morgan Arabian, MD    History of Present Illness:    Morgan Mullins is a 85 y.o. female with a hx of breast cancer s/p mastectomy, colon cancer s/p partial colectomy, HTN, HLD, and osteopenia who returns to clinic for follow-up of for bradycardia.   During last visit on 10/19/20, she was having dizziness that was worse with exertion. HR 40-60s. Monitor showed sinus rhythm with average HR 54. Ranged from 36-154bpm. Lowest HR during sleep mainly 40-50s. During the day, HR appeared to augment. No significant pauses. Exercise stress with exaggerated HR and BP response to exercise. No evidence of ischemia. No evidence of chronotropic incompetence. TTE with EF 60-65%, G2DD, no significant valvular abnormalities.   Patient states that she continues to feel forgetful, lightheaded, and dizzy. Symptoms are worse when walking but occur at rest as well. This has been ongoing for a couple of years. No chest pain, SOB, nausea or syncope. No bleeding issues. TSH normal. Blood pressures mainly in the 130s at home. Has been compliant with all medications.   Past Medical History:  Diagnosis Date  . Breast cancer, right breast ([place]) 1/92   S/P mastectomy  . Cancer of left breast ([place]) 09/2016   S/P mastectomy 11/13/2016  . Colon cancer ([place]) 2001   S/P "took 11 inches of my colon out"  . Colon polyps   . DJD (degenerative joint disease)    arthritis in neck  . Glaucoma   . Hyperlipidemia   . Hypertension   . Infertility   . Osteopenia   . PONV (postoperative nausea and vomiting)     Past Surgical History:  Procedure Laterality Date  . BREAST BIOPSY Right 1992  . BREAST BIOPSY Left 09/2016  .  CATARACT EXTRACTION W/ INTRAOCULAR LENS  IMPLANT, BILATERAL Bilateral 2002-2003   left 10/02, right 2003  . CHOLECYSTECTOMY OPEN  04/1984  . COLECTOMY  02/20/2000   h/o colon resection secondary to villous adenoma  . MASTECTOMY Right 09/1990  . MASTECTOMY COMPLETE / SIMPLE W/ SENTINEL NODE BIOPSY Left 11/11/2016   axillary  . MASTECTOMY W/ SENTINEL NODE BIOPSY Left 11/11/2016   Procedure: MASTECTOMY WITH AXILLARY SENTINEL LYMPH NODE BIOPSY;  Surgeon: Alphonsa Overall, MD;  Location: [facility];  Service: General;  Laterality: Left;  . TUBAL LIGATION      Current Medications: Current Meds  Medication Sig  . ALPRAZolam (XANAX) 0.25 MG tablet Take 1 tablet (0.25 mg total) by mouth at bedtime as needed for anxiety.  Marland Kitchen amoxicillin (AMOXIL) 500 MG capsule Take by mouth.  . dorzolamide-timolol (COSOPT) 22.3-6.8 MG/ML ophthalmic solution 1 drop 2 (two) times daily.  Marland Kitchen lisinopril (ZESTRIL) 40 MG tablet Take 1 tablet (40 mg total) by mouth daily.  [name] (RHOPRESSA) 0.02 % SOLN Place 1 drop into the left eye nightly.  . rosuvastatin (CRESTOR) 10 MG tablet Take 1 tablet (10 mg total) by mouth daily.  . [DISCONTINUED] lisinopril-hydrochlorothiazide (PRINZIDE,ZESTORETIC) 20-12.5 MG per tablet Take 1 tablet by mouth daily.  . [DISCONTINUED] simvastatin (ZOCOR) 40 MG tablet Take 40 mg by mouth every morning.     Allergies:   No known allergies   Social History   Socioeconomic  History  . Marital status: Married    Spouse name: Not on file  . Number of children: 0  . Years of education: Not on file  . Highest education level: High school graduate  Occupational History  . Not on file  Tobacco Use  . Smoking status: Never Smoker  . Smokeless tobacco: Never Used  Vaping Use  . Vaping Use: Never used  Substance and Sexual Activity  . Alcohol use: No  . Drug use: No  . Sexual activity: Not Currently    Partners: Male    Birth control/protection: Post-menopausal  Other Topics Concern   . Not on file  Social History Narrative   Married, no children lives with spouse in 1 story home   High school grad   Right handed   Drinks 1 cup of coffee in morning, tea sometimes, no soda   Social Determinants of Health   Financial Resource Strain: Not on file  Food Insecurity: Not on file  Transportation Needs: Not on file  Physical Activity: Not on file  Stress: Not on file  Social Connections: Not on file     Family History: The patient's family history includes Breast cancer in her paternal aunt; Colon cancer in her father; Ovarian cancer in her mother.  ROS:   Please see the history of present illness.     Review of Systems  Constitutional: Positive for malaise/fatigue. Negative for chills and fever.  HENT: Negative for hearing loss.   Eyes: Negative for blurred vision.  Respiratory: Negative for shortness of breath.   Cardiovascular: Negative for chest pain, palpitations, orthopnea, claudication, leg swelling and PND.  Gastrointestinal: Negative for melena, nausea and vomiting.  Genitourinary: Negative for dysuria.  Musculoskeletal: Positive for joint pain.  Neurological: Positive for dizziness. Negative for loss of consciousness.  Endo/Heme/Allergies: Negative for polydipsia.  Psychiatric/Behavioral: Positive for memory loss.    EKGs/Labs/Other Studies Reviewed:    The following studies were reviewed today: Cardiac Monitor 10/19/20:  Patch wear time was 6 days and 22 hours  Predominant rhythm was sinus rhythm with average HR 54 (ranging from 36bpm to 148bpm)  There were 6 runs of SVT with fastest lasting 4 beats with max rate of 148bpm; longest was 12 beats with average HR 104bpm  Rare SVEs, rare PVCs  No AFib, significant pauses or VT  ABI 2015: Right Lower Extremity: Normal triphasic waveforms throughout the  right lower extremity. No significant focal pressure drop. Normal  pulse volume recordings.   Left Lower Extremity: Normal triphasic  waveforms throughout the left  lower extremity. No significant focal pressure drop. Normal pulse  volume recordings.   IMPRESSION:  Normal examination. No evidence of peripheral arterial disease.   MRI head 2020:  IMPRESSION: 1. 5 x 7 mm ill-defined enhancement left insula with evidence of prior hemorrhage. I would favor this is a benign lesion such as a cavernoma or capillary telangiectasia however given history of breast cancer, follow-up MRI suggested in 2-3 months to evaluate for interval change. No other suspicious lesions identified. 2. No acute abnormality. Mild chronic microvascular ischemia in the white matter and pons.  Exercise Tolerance 11/14/20:  Blood pressure demonstrated a hypertensive response to exercise.  There was no ST segment deviation noted during stress.  No T wave inversion was noted during stress.  The test was stopped because the patient complained of fatigue and shortness of breath  Overall, the patient's exercise capacity was mildly impaired.   Negative adequate stress test without evidence of ischemia  at given workload. Mildly impaired exercise capacity with hypertensive response to exercise and exaggerated HR increase. Occasional PVC's noted with exercise.  TTE 11/14/20: IMPRESSIONS  1. Left ventricular ejection fraction, by estimation, is 60 to 65%. The  left ventricle has normal function. The left ventricle has no regional  wall motion abnormalities. Left ventricular diastolic parameters are  consistent with Grade II diastolic  dysfunction (pseudonormalization). Elevated left ventricular end-diastolic  pressure. The average left ventricular global longitudinal strain is -26.9  %. The global longitudinal strain is normal.  2. Right ventricular systolic function is normal. The right ventricular  size is normal. There is normal pulmonary artery systolic pressure.  3. Left atrial size was mildly dilated.  4. The mitral valve is normal in  structure. Trivial mitral valve  regurgitation. No evidence of mitral stenosis.  5. The aortic valve is normal in structure. Aortic valve regurgitation is  not visualized. No aortic stenosis is present.  6. The inferior vena cava is normal in size with greater than 50%  respiratory variability, suggesting right atrial pressure of 3 mmHg.    Recent Labs: No results found for requested labs within last 8760 hours.  Recent Lipid Panel No results found for: CHOL, TRIG, HDL, CHOLHDL, VLDL, LDLCALC, LDLDIRECT    Physical Exam:    VS:  BP 140/80   Pulse (!) 56   Ht 5\' 6"  (1.676 m)   Wt 123 lb 9.6 oz (56.1 kg)   LMP 09/17/1987   SpO2 95%   BMI 19.95 kg/m     Wt Readings from Last 3 Encounters:  11/16/20 123 lb 9.6 oz (56.1 kg)  10/19/20 125 lb 9.6 oz (57 kg)  10/01/19 128 lb 4 oz (58.2 kg)     GEN:  Well nourished, well developed in no acute distress HEENT: Normal NECK: No JVD; No carotid bruits CARDIAC: Bradycardic, regular, soft systolic murmur. No rubs, gallops RESPIRATORY:  Clear to auscultation without rales, wheezing or rhonchi  ABDOMEN: Soft, non-tender, non-distended MUSCULOSKELETAL:  No edema; No deformity  SKIN: Warm and dry NEUROLOGIC:  Alert and oriented x 3 PSYCHIATRIC:  Normal affect   ASSESSMENT:    1. Lightheadedness   2. Pure hypercholesterolemia   3. Sinus bradycardia   4. Dizziness   5. Primary hypertension   6. History of colon cancer, stage I, right colon    PLAN:    In order of problems listed above:  #Dizziness/Lightheadedness #Sinus bradycardia: Patient with constant sensation of being lightheaded/dizzy for the past year with initial concern for symptomatic bradycardia. Cardiac monitor with sinus rhythm/sinus brady with average HR 54 but augments appropriately during the day time. No sustained HR <40s. No pauses or Afib. Exercise treadmill with exaggerated HR response with no evidence of chronotropic incompetence or ischemia. Unclear  etiology of her symptoms, but fortunately, does not appear to be cardiac in nature or related to symptomatic bradycardia. -Cardiac monitor reassuring with average HR 54 with no pauses, no sustained HR <30s and appropriate augmentation of HR during the day and with activity -Exercise treadmill without chronotropic incompetence or ischemia -TTE with normal LVEF, G2DD, no valvular abnormalities -Will stop the HCTZ component of lisinopril-HCTZ pill as orthostasis/dehydration may make symptoms worse  -Avoid nodal agents -TSH normal -No EP referral needed  #HTN: -Change to lisinopril 40mg  daily--will take out HCTZ component in case orthostasis contributing to dizziness -Monitor blood pressures at home  #HLD: -Change simva to crestor as LDL above goal at 118 -Repeat lipid panel prior to next  visit in 3 months  #History of breast cancer and colon cancer: Doing well post-operatively.  -Follow-up with primary provider    Medication Adjustments/Labs and Tests Ordered: Current medicines are reviewed at length with the patient today.  Concerns regarding medicines are outlined above.  Orders Placed This Encounter  Procedures  . Basic metabolic panel  . Hepatic function panel  . Lipid panel   Meds ordered this encounter  Medications  . lisinopril (ZESTRIL) 40 MG tablet    Sig: Take 1 tablet (40 mg total) by mouth daily.    Dispense:  30 tablet    Refill:  11  . rosuvastatin (CRESTOR) 10 MG tablet    Sig: Take 1 tablet (10 mg total) by mouth daily.    Dispense:  30 tablet    Refill:  11    Patient Instructions  Medication Instructions:  1) STOP LISINOPRIL/HCTZ combo drug 2) START LISINOPRIL 40 mg daily 3) STOP SIMVASTATIN 4) START CRESTOR (rosuvastatin) 10 mg daily  *If you need a refill on your cardiac medications before your next appointment, please call your pharmacy*  Lab Work: Your provider recommends that you return for lab work in 1 week. You do NOT need to be  fasting. If you have labs (blood work) drawn today and your tests are completely normal, you will receive your results only by: Marland Kitchen MyChart Message (if you have MyChart) OR . A paper copy in the mail If you have any lab test that is abnormal or we need to change your treatment, we will call you to review the results.  Follow-Up: Your EP visit has been cancelled.  Your provider recommends that you schedule a follow-up appointment in 3 months. Please come FASTING as we will recheck cholesterol that day.     Signed, Morgan Bergeron, MD  11/16/2020 10:07 AM    [place]

## 2020-11-14 ENCOUNTER — Ambulatory Visit: Payer: Medicare Other

## 2020-11-14 ENCOUNTER — Ambulatory Visit (HOSPITAL_BASED_OUTPATIENT_CLINIC_OR_DEPARTMENT_OTHER): Payer: Medicare Other

## 2020-11-14 ENCOUNTER — Other Ambulatory Visit: Payer: Self-pay

## 2020-11-14 ENCOUNTER — Ambulatory Visit (HOSPITAL_COMMUNITY)
Admission: RE | Admit: 2020-11-14 | Discharge: 2020-11-14 | Disposition: A | Discharge status: Home / Self Care | Payer: Medicare Other | Source: Ambulatory Visit | Attending: Cardiology | Admitting: Cardiology

## 2020-11-14 DIAGNOSIS — R001 Bradycardia, unspecified: Secondary | ICD-10-CM

## 2020-11-14 DIAGNOSIS — R55 Syncope and collapse: Secondary | ICD-10-CM

## 2020-11-14 LAB — EXERCISE TOLERANCE TEST
Estimated workload: 7.1 METS
Exercise duration (min): 6 min
Exercise duration (sec): 5 s
MPHR: 137 {beats}/min
Peak HR: 126 {beats}/min
Percent HR: 91 %
Rest HR: 55 {beats}/min

## 2020-11-14 LAB — ECHOCARDIOGRAM COMPLETE
Area-P 1/2: 3.61 cm2
S' Lateral: 2.5 cm

## 2020-11-16 ENCOUNTER — Encounter: Payer: Self-pay | Admitting: Cardiology

## 2020-11-16 ENCOUNTER — Other Ambulatory Visit: Payer: Self-pay

## 2020-11-16 ENCOUNTER — Ambulatory Visit: Payer: Medicare Other | Admitting: Cardiology

## 2020-11-16 VITALS — BP 140/80 | HR 56 | Ht 66.0 in | Wt 123.6 lb

## 2020-11-16 DIAGNOSIS — I10 Essential (primary) hypertension: Secondary | ICD-10-CM | POA: Diagnosis not present

## 2020-11-16 DIAGNOSIS — R42 Dizziness and giddiness: Secondary | ICD-10-CM | POA: Diagnosis not present

## 2020-11-16 DIAGNOSIS — E7800 Pure hypercholesterolemia, unspecified: Secondary | ICD-10-CM | POA: Diagnosis not present

## 2020-11-16 DIAGNOSIS — R001 Bradycardia, unspecified: Secondary | ICD-10-CM | POA: Diagnosis not present

## 2020-11-16 DIAGNOSIS — Z85038 Personal history of other malignant neoplasm of large intestine: Secondary | ICD-10-CM

## 2020-11-16 MED ORDER — LISINOPRIL 40 MG PO TABS: 40.0000 mg | ORAL_TABLET | Freq: Every day | ORAL | 11 refills | Status: AC

## 2020-11-16 MED ORDER — ROSUVASTATIN CALCIUM 10 MG PO TABS: 10.0000 mg | ORAL_TABLET | Freq: Every day | ORAL | 11 refills | Status: DC

## 2020-11-16 NOTE — Patient Instructions (Signed)
Medication Instructions:  1) STOP LISINOPRIL/HCTZ combo drug 2) START LISINOPRIL 40 mg daily 3) STOP SIMVASTATIN 4) START CRESTOR (rosuvastatin) 10 mg daily  *If you need a refill on your cardiac medications before your next appointment, please call your pharmacy*  Lab Work: Your provider recommends that you return for lab work in 1 week. You do NOT need to be fasting. If you have labs (blood work) drawn today and your tests are completely normal, you will receive your results only by: Marland Kitchen MyChart Message (if you have MyChart) OR . A paper copy in the mail If you have any lab test that is abnormal or we need to change your treatment, we will call you to review the results.  Follow-Up: Your EP visit has been cancelled.  Your provider recommends that you schedule a follow-up appointment in 3 months. Please come FASTING as we will recheck cholesterol that day.

## 2020-11-23 ENCOUNTER — Other Ambulatory Visit: Payer: Medicare Other | Admitting: *Deleted

## 2020-11-23 ENCOUNTER — Other Ambulatory Visit: Payer: Self-pay

## 2020-11-23 ENCOUNTER — Institutional Professional Consult (permissible substitution): Payer: Medicare Other | Admitting: Internal Medicine

## 2020-11-23 DIAGNOSIS — R42 Dizziness and giddiness: Secondary | ICD-10-CM

## 2020-11-23 LAB — BASIC METABOLIC PANEL
BUN/Creatinine Ratio: 8 — ABNORMAL LOW (ref 12–28)
BUN: 7 mg/dL — ABNORMAL LOW (ref 8–27)
CO2: 22 mmol/L (ref 20–29)
Calcium: 9.5 mg/dL (ref 8.7–10.3)
Chloride: 101 mmol/L (ref 96–106)
Creatinine, Ser: 0.89 mg/dL (ref 0.57–1.00)
Glucose: 88 mg/dL (ref 65–99)
Potassium: 4.5 mmol/L (ref 3.5–5.2)
Sodium: 137 mmol/L (ref 134–144)
eGFR: 64 mL/min/{1.73_m2} (ref >59)

## 2020-11-24 ENCOUNTER — Telehealth: Payer: Self-pay

## 2020-11-24 NOTE — Telephone Encounter (Signed)
Attempted phone call to pt.  Phone line is busy.  Will attempt call again later.

## 2020-11-24 NOTE — Telephone Encounter (Signed)
-----   Message from Freada Bergeron, MD sent at 11/24/2020 10:47 AM EST ----- Electrolytes and kidney function look great. No changes in meds. I hope she is feeling better!

## 2020-11-29 ENCOUNTER — Telehealth: Payer: Self-pay

## 2020-11-29 NOTE — Telephone Encounter (Signed)
I received message from front desk.  "Patient would like to know if she has aged out of needing a gyn yearly." I called and spoke with patient and let her know that Dr. Ammie Ferrier office note last year reads "Return annually or prn."  Patient would like to continue her care with Dr. Sabra Heck so I provided her with the phone number to schedule an appointment.

## 2020-12-15 ENCOUNTER — Ambulatory Visit: Payer: Medicare Other

## 2020-12-28 ENCOUNTER — Other Ambulatory Visit: Payer: Self-pay | Admitting: Family Medicine

## 2020-12-28 DIAGNOSIS — G939 Disorder of brain, unspecified: Secondary | ICD-10-CM

## 2020-12-28 DIAGNOSIS — R413 Other amnesia: Secondary | ICD-10-CM

## 2021-01-23 ENCOUNTER — Other Ambulatory Visit: Payer: Medicare Other

## 2021-02-15 ENCOUNTER — Ambulatory Visit: Payer: Medicare Other | Admitting: Cardiology

## 2021-04-10 ENCOUNTER — Other Ambulatory Visit: Payer: Self-pay | Admitting: Family Medicine

## 2021-04-10 DIAGNOSIS — G939 Disorder of brain, unspecified: Secondary | ICD-10-CM

## 2021-04-10 DIAGNOSIS — R413 Other amnesia: Secondary | ICD-10-CM

## 2021-11-12 ENCOUNTER — Other Ambulatory Visit: Payer: Self-pay | Admitting: Cardiology

## 2021-11-12 DIAGNOSIS — E7800 Pure hypercholesterolemia, unspecified: Secondary | ICD-10-CM

## 2021-11-15 ENCOUNTER — Other Ambulatory Visit: Payer: Self-pay

## 2021-11-15 MED ORDER — ROSUVASTATIN CALCIUM 10 MG PO TABS: 10.0000 mg | ORAL_TABLET | Freq: Every day | ORAL | 0 refills | Status: DC

## 2021-12-11 ENCOUNTER — Other Ambulatory Visit: Payer: Self-pay | Admitting: *Deleted

## 2021-12-11 MED ORDER — ROSUVASTATIN CALCIUM 10 MG PO TABS: 10.0000 mg | ORAL_TABLET | Freq: Every day | ORAL | 0 refills | Status: AC

## 2023-07-29 ENCOUNTER — Emergency Department (HOSPITAL_COMMUNITY)
Admission: EM | Admit: 2023-07-29 | Discharge: 2023-07-29 | Disposition: A | Discharge status: Home / Self Care | Payer: Medicare Other | Attending: Emergency Medicine | Admitting: Emergency Medicine

## 2023-07-29 ENCOUNTER — Emergency Department (HOSPITAL_COMMUNITY): Payer: Medicare Other

## 2023-07-29 ENCOUNTER — Other Ambulatory Visit: Payer: Self-pay

## 2023-07-29 DIAGNOSIS — Z85038 Personal history of other malignant neoplasm of large intestine: Secondary | ICD-10-CM | POA: Insufficient documentation

## 2023-07-29 DIAGNOSIS — A084 Viral intestinal infection, unspecified: Secondary | ICD-10-CM | POA: Diagnosis not present

## 2023-07-29 DIAGNOSIS — Z853 Personal history of malignant neoplasm of breast: Secondary | ICD-10-CM | POA: Insufficient documentation

## 2023-07-29 DIAGNOSIS — I10 Essential (primary) hypertension: Secondary | ICD-10-CM | POA: Insufficient documentation

## 2023-07-29 DIAGNOSIS — R109 Unspecified abdominal pain: Secondary | ICD-10-CM | POA: Diagnosis present

## 2023-07-29 LAB — COMPREHENSIVE METABOLIC PANEL
ALT: 17 U/L (ref 0–44)
AST: 27 U/L (ref 15–41)
Albumin: 3.9 g/dL (ref 3.5–5.0)
Alkaline Phosphatase: 41 U/L (ref 38–126)
Anion gap: 10 (ref 5–15)
BUN: 23 mg/dL (ref 8–23)
CO2: 22 mmol/L (ref 22–32)
Calcium: 9.6 mg/dL (ref 8.9–10.3)
Chloride: 104 mmol/L (ref 98–111)
Creatinine, Ser: 1.16 mg/dL — ABNORMAL HIGH (ref 0.44–1.00)
GFR, Estimated: 46 mL/min — ABNORMAL LOW (ref >60)
Glucose, Bld: 113 mg/dL — ABNORMAL HIGH (ref 70–99)
Potassium: 3.7 mmol/L (ref 3.5–5.1)
Sodium: 136 mmol/L (ref 135–145)
Total Bilirubin: 0.6 mg/dL (ref <1.2)
Total Protein: 6.8 g/dL (ref 6.5–8.1)

## 2023-07-29 LAB — URINALYSIS, ROUTINE W REFLEX MICROSCOPIC
Bilirubin Urine: NEGATIVE
Glucose, UA: NEGATIVE mg/dL
Hgb urine dipstick: NEGATIVE
Ketones, ur: 5 mg/dL — AB
Leukocytes,Ua: NEGATIVE
Nitrite: NEGATIVE
Protein, ur: NEGATIVE mg/dL
Specific Gravity, Urine: 1.019 (ref 1.005–1.030)
pH: 6 (ref 5.0–8.0)

## 2023-07-29 LAB — CBC
HCT: 38.6 % (ref 36.0–46.0)
Hemoglobin: 12.8 g/dL (ref 12.0–15.0)
MCH: 30 pg (ref 26.0–34.0)
MCHC: 33.2 g/dL (ref 30.0–36.0)
MCV: 90.6 fL (ref 80.0–100.0)
Platelets: 263 10*3/uL (ref 150–400)
RBC: 4.26 MIL/uL (ref 3.87–5.11)
RDW: 12.3 % (ref 11.5–15.5)
WBC: 11.7 10*3/uL — ABNORMAL HIGH (ref 4.0–10.5)
nRBC: 0 % (ref 0.0–0.2)

## 2023-07-29 LAB — LIPASE, BLOOD: Lipase: 47 U/L (ref 11–51)

## 2023-07-29 MED ORDER — ONDANSETRON HCL 4 MG PO TABS: 4.0000 mg | ORAL_TABLET | Freq: Three times a day (TID) | ORAL | 0 refills | Status: AC | PRN

## 2023-07-29 MED ORDER — IOPAMIDOL (ISOVUE-370) INJECTION 76%
75.0000 mL | Freq: Once | INTRAVENOUS | Status: AC | PRN
  Administered 2023-07-29: 75 mL via INTRAVENOUS

## 2023-07-29 MED ORDER — SODIUM CHLORIDE 0.9 % IV BOLUS
1000.0000 mL | Freq: Once | INTRAVENOUS | Status: AC
  Administered 2023-07-29: 1000 mL via INTRAVENOUS

## 2023-07-29 MED ORDER — ONDANSETRON 4 MG PO TBDP
4.0000 mg | ORAL_TABLET | Freq: Once | ORAL | Status: AC | PRN
  Administered 2023-07-29: 4 mg via ORAL
  Filled 2023-07-29: qty 1

## 2023-07-29 NOTE — ED Notes (Signed)
Patient transported to CT 

## 2023-07-29 NOTE — ED Provider Notes (Signed)
[place] [facility] AT [facility] Provider Note   CSN: [id number] Arrival date & time: 07/29/23  1536     History  Chief Complaint  Patient presents with   Emesis   Abdominal Pain    Morgan Mullins is a 87 y.o. female.  With a past history of colon cancer, breast cancer, status post cholecystectomy and hypertension presents to the ED for abdominal pain.  Symptoms of abdominal pain nausea vomiting and diarrhea began this morning and have persisted since onset.  Several episodes of vomiting with nonbloody nonbilious emesis.  Generalized abdominal pain.  No fevers chills chest pain or shortness of breath.   Emesis Associated symptoms: abdominal pain   Abdominal Pain Associated symptoms: vomiting        Home Medications Prior to Admission medications   Medication Sig Start Date End Date Taking? Authorizing Provider  ondansetron (ZOFRAN) 4 MG tablet Take 1 tablet (4 mg total) by mouth every 8 (eight) hours as needed for nausea or vomiting. 07/29/23  Yes Royanne Foots, DO  ALPRAZolam Prudy Feeler) 0.25 MG tablet Take 1 tablet (0.25 mg total) by mouth at bedtime as needed for anxiety. 03/21/17   Jerene Bears, MD  amoxicillin (AMOXIL) 500 MG capsule Take by mouth. 11/14/20   [provider]  dorzolamide-timolol (COSOPT) 22.3-6.8 MG/ML ophthalmic solution 1 drop 2 (two) times daily. 08/17/19   [provider]  lisinopril (ZESTRIL) 40 MG tablet Take 1 tablet (40 mg total) by mouth daily. 11/16/20 11/11/21  Meriam Sprague, MD  Netarsudil Dimesylate (RHOPRESSA) 0.02 % SOLN Place 1 drop into the left eye nightly. 08/17/19   [provider]  rosuvastatin (CRESTOR) 10 MG tablet Take 1 tablet (10 mg total) by mouth daily. Please make overdue appt with Dr. Shari Prows before anymore refills. Thank you 1st attempt 12/11/21   Meriam Sprague, MD      Allergies    No known allergies    Review of Systems   Review of Systems  Gastrointestinal:   Positive for abdominal pain and vomiting.    Physical Exam Updated Vital Signs BP (!) 142/70   Pulse (!) 51   Temp 97.6 F (36.4 C)   Resp 14   Ht 5\' 6"  (1.676 m)   Wt 51.7 kg   LMP 09/17/1987   SpO2 98%   BMI 18.40 kg/m  Physical Exam Vitals and nursing note reviewed.  HENT:     Head: Normocephalic and atraumatic.  Eyes:     Pupils: Pupils are equal, round, and reactive to light.  Cardiovascular:     Rate and Rhythm: Normal rate and regular rhythm.  Pulmonary:     Effort: Pulmonary effort is normal.     Breath sounds: Normal breath sounds.  Abdominal:     Palpations: Abdomen is soft.     Tenderness: There is no abdominal tenderness.  Skin:    General: Skin is warm and dry.  Neurological:     Mental Status: She is alert.  Psychiatric:        Mood and Affect: Mood normal.     ED Results / Procedures / Treatments   Labs (all labs ordered are listed, but only abnormal results are displayed) Labs Reviewed  COMPREHENSIVE METABOLIC PANEL - Abnormal; Notable for the following components:      Result Value   Glucose, Bld 113 (*)    Creatinine, Ser 1.16 (*)    GFR, Estimated 46 (*)    All other components within  normal limits  CBC - Abnormal; Notable for the following components:   WBC 11.7 (*)    All other components within normal limits  URINALYSIS, ROUTINE W REFLEX MICROSCOPIC - Abnormal; Notable for the following components:   APPearance HAZY (*)    Ketones, ur 5 (*)    All other components within normal limits  LIPASE, BLOOD    EKG None  Radiology CT ABDOMEN PELVIS W CONTRAST  Result Date: 07/29/2023 CLINICAL DATA:  Acute nonlocalized abdominal pain. Emesis. History of colon cancer. Nausea and vomiting. EXAM: CT ABDOMEN AND PELVIS WITH CONTRAST TECHNIQUE: Multidetector CT imaging of the abdomen and pelvis was performed using the standard protocol following bolus administration of intravenous contrast. RADIATION DOSE REDUCTION: This exam was performed  according to the departmental dose-optimization program which includes automated exposure control, adjustment of the mA and/or kV according to patient size and/or use of iterative reconstruction technique. CONTRAST:  75mL ISOVUE-370 IOPAMIDOL (ISOVUE-370) INJECTION 76% COMPARISON:  CT abdomen pelvis 08/09/2014 FINDINGS: Lower chest: No acute abnormality. Hepatobiliary: Cholecystectomy. Unchanged hypoattenuating lesions in the liver compatible with cysts. Prominent intra and extrahepatic bile ducts likely due to reservoir effect. Pancreas: Unremarkable. Spleen: Unremarkable. Adrenals/Urinary Tract: Stable adrenal glands. No urinary calculi or hydronephrosis. Stomach/Bowel: Stomach is within normal limits. Postoperative changes about the colon with anastomosis in the right abdomen. Colonic diverticulosis without diverticulitis. Fluid-filled small bowel loops with possible mild thickening of the wall and valvulae conniventes. Vascular/Lymphatic: Aortic atherosclerotic calcification. Left central mesenteric lymph nodes have slightly increased compared to 2015. For example 10 mm node on 3/40 previously measured 5 mm. Reproductive: No acute abnormality. Other: Mesenteric edema.  No free intraperitoneal air. Musculoskeletal: No acute fracture. IMPRESSION: 1. Possible mild enteritis 2. Left central mesenteric lymph nodes have slightly increased compared to 2015 and are nonspecific. Recommend attention on follow-up. 3.  Colonic diverticulosis without diverticulitis. Aortic Atherosclerosis (ICD10-I70.0). Electronically Signed   By: Minerva Fester M.D.   On: 07/29/2023 22:51    Procedures Procedures    Medications Ordered in ED Medications  ondansetron (ZOFRAN-ODT) disintegrating tablet 4 mg (4 mg Oral Given 07/29/23 1633)  sodium chloride 0.9 % bolus 1,000 mL (0 mLs Intravenous Stopped 07/29/23 1905)  iopamidol (ISOVUE-370) 76 % injection 75 mL (75 mLs Intravenous Contrast Given 07/29/23 1950)    ED Course/  Medical Decision Making/ A&P Clinical Course as of 07/29/23 2307  Tue Jul 29, 2023  2305 CT abdomen pelvis shows enteritis no other acute findings.  Patient remained stable.  Will send her home with prescription for Zofran and instructed for symptomatic management of likely viral GI illness. [MP]    Clinical Course User Index [MP] Royanne Foots, DO                                 Medical Decision Making 87 year old female with history as above presenting for 1 day of abdominal pain nausea vomiting diarrhea.  Afebrile slightly hypertensive.  Benign physical exam without abdominal tenderness.  Patient reports improvement in her symptoms after Zofran in the ED and having a bowel movement in the ED.  No urinary symptoms.  Most likely etiology would be viral gastritis/gastroenteritis but will evaluate for other intra-abdominal pathology such as diverticulitis or colitis.  Amount and/or Complexity of Data Reviewed Labs: ordered. Radiology: ordered.  Risk Prescription drug management.           Final Clinical Impression(s) / ED Diagnoses Final diagnoses:  Viral gastroenteritis    Rx / DC Orders ED Discharge Orders          Ordered    ondansetron (ZOFRAN) 4 MG tablet  Every 8 hours PRN        07/29/23 2306              Royanne Foots, DO 07/29/23 2307

## 2023-07-29 NOTE — ED Triage Notes (Signed)
Patient with generalized abdominal pain, n/v/d today. Husband reports numerous episodes of emesis but only small amounts each time, and just a few episodes of diarrhea this morning.

## 2023-07-29 NOTE — Discharge Instructions (Signed)
You are seen in the emergency department for abdominal pain nausea and vomiting Your blood work and CAT scan were most consistent with a viral stomach bug We have called in a prescription for Zofran for you to pick up from your pharmacy and take as directed for nausea and vomiting at home Take Tylenol Motrin as directed for discomfort Keep well-hydrated Return to the emergency department for severe abdominal pain or if you are unable to eat or drink Otherwise please follow-up with your primary care doctor in 1 week for reevaluation

## 2023-09-19 ENCOUNTER — Other Ambulatory Visit: Payer: Self-pay

## 2023-09-19 ENCOUNTER — Emergency Department (HOSPITAL_BASED_OUTPATIENT_CLINIC_OR_DEPARTMENT_OTHER): Payer: Medicare Other

## 2023-09-19 ENCOUNTER — Encounter (HOSPITAL_BASED_OUTPATIENT_CLINIC_OR_DEPARTMENT_OTHER): Payer: Self-pay | Admitting: Emergency Medicine

## 2023-09-19 ENCOUNTER — Emergency Department (HOSPITAL_BASED_OUTPATIENT_CLINIC_OR_DEPARTMENT_OTHER)
Admission: EM | Admit: 2023-09-19 | Discharge: 2023-09-19 | Disposition: A | Discharge status: Home / Self Care | Payer: Medicare Other | Attending: Emergency Medicine | Admitting: Emergency Medicine

## 2023-09-19 ENCOUNTER — Emergency Department (HOSPITAL_BASED_OUTPATIENT_CLINIC_OR_DEPARTMENT_OTHER): Payer: Medicare Other | Admitting: Radiology

## 2023-09-19 DIAGNOSIS — I10 Essential (primary) hypertension: Secondary | ICD-10-CM | POA: Insufficient documentation

## 2023-09-19 DIAGNOSIS — R251 Tremor, unspecified: Secondary | ICD-10-CM | POA: Diagnosis not present

## 2023-09-19 DIAGNOSIS — E7800 Pure hypercholesterolemia, unspecified: Secondary | ICD-10-CM | POA: Insufficient documentation

## 2023-09-19 DIAGNOSIS — R001 Bradycardia, unspecified: Secondary | ICD-10-CM | POA: Diagnosis not present

## 2023-09-19 DIAGNOSIS — Z20822 Contact with and (suspected) exposure to covid-19: Secondary | ICD-10-CM | POA: Insufficient documentation

## 2023-09-19 DIAGNOSIS — R112 Nausea with vomiting, unspecified: Secondary | ICD-10-CM | POA: Insufficient documentation

## 2023-09-19 LAB — URINALYSIS, ROUTINE W REFLEX MICROSCOPIC
Bilirubin Urine: NEGATIVE
Glucose, UA: NEGATIVE mg/dL
Hgb urine dipstick: NEGATIVE
Leukocytes,Ua: NEGATIVE
Nitrite: NEGATIVE
Protein, ur: NEGATIVE mg/dL
Specific Gravity, Urine: 1.021 (ref 1.005–1.030)
pH: 7 (ref 5.0–8.0)

## 2023-09-19 LAB — CBC WITH DIFFERENTIAL/PLATELET
Abs Immature Granulocytes: 0.05 10*3/uL (ref 0.00–0.07)
Basophils Absolute: 0 10*3/uL (ref 0.0–0.1)
Basophils Relative: 0 %
Eosinophils Absolute: 0.1 10*3/uL (ref 0.0–0.5)
Eosinophils Relative: 1 %
HCT: 34.8 % — ABNORMAL LOW (ref 36.0–46.0)
Hemoglobin: 12 g/dL (ref 12.0–15.0)
Immature Granulocytes: 1 %
Lymphocytes Relative: 9 %
Lymphs Abs: 0.8 10*3/uL (ref 0.7–4.0)
MCH: 30.8 pg (ref 26.0–34.0)
MCHC: 34.5 g/dL (ref 30.0–36.0)
MCV: 89.5 fL (ref 80.0–100.0)
Monocytes Absolute: 0.2 10*3/uL (ref 0.1–1.0)
Monocytes Relative: 2 %
Neutro Abs: 8.2 10*3/uL — ABNORMAL HIGH (ref 1.7–7.7)
Neutrophils Relative %: 87 %
Platelets: 234 10*3/uL (ref 150–400)
RBC: 3.89 MIL/uL (ref 3.87–5.11)
RDW: 12.8 % (ref 11.5–15.5)
WBC: 9.3 10*3/uL (ref 4.0–10.5)
nRBC: 0 % (ref 0.0–0.2)

## 2023-09-19 LAB — COMPREHENSIVE METABOLIC PANEL
ALT: 10 U/L (ref 0–44)
AST: 15 U/L (ref 15–41)
Albumin: 3.9 g/dL (ref 3.5–5.0)
Alkaline Phosphatase: 40 U/L (ref 38–126)
Anion gap: 9 (ref 5–15)
BUN: 25 mg/dL — ABNORMAL HIGH (ref 8–23)
CO2: 26 mmol/L (ref 22–32)
Calcium: 9.4 mg/dL (ref 8.9–10.3)
Chloride: 103 mmol/L (ref 98–111)
Creatinine, Ser: 0.96 mg/dL (ref 0.44–1.00)
GFR, Estimated: 58 mL/min — ABNORMAL LOW (ref >60)
Glucose, Bld: 116 mg/dL — ABNORMAL HIGH (ref 70–99)
Potassium: 3.6 mmol/L (ref 3.5–5.1)
Sodium: 138 mmol/L (ref 135–145)
Total Bilirubin: 0.5 mg/dL (ref 0.0–1.2)
Total Protein: 6.5 g/dL (ref 6.5–8.1)

## 2023-09-19 LAB — LIPASE, BLOOD: Lipase: 32 U/L (ref 11–51)

## 2023-09-19 LAB — RESP PANEL BY RT-PCR (RSV, FLU A&B, COVID)  RVPGX2
Influenza A by PCR: NEGATIVE
Influenza B by PCR: NEGATIVE
Resp Syncytial Virus by PCR: NEGATIVE
SARS Coronavirus 2 by RT PCR: NEGATIVE

## 2023-09-19 LAB — TROPONIN I (HIGH SENSITIVITY): Troponin I (High Sensitivity): 5 ng/L (ref <18)

## 2023-09-19 LAB — CBG MONITORING, ED: Glucose-Capillary: 109 mg/dL — ABNORMAL HIGH (ref 70–99)

## 2023-09-19 MED ORDER — SODIUM CHLORIDE 0.9 % IV BOLUS
1000.0000 mL | Freq: Once | INTRAVENOUS | Status: AC
  Administered 2023-09-19: 1000 mL via INTRAVENOUS

## 2023-09-19 MED ORDER — ONDANSETRON HCL 4 MG/2ML IJ SOLN
4.0000 mg | Freq: Once | INTRAMUSCULAR | Status: AC
  Administered 2023-09-19: 4 mg via INTRAVENOUS
  Filled 2023-09-19: qty 2

## 2023-09-19 MED ORDER — ONDANSETRON 4 MG PO TBDP: 4.0000 mg | ORAL_TABLET | Freq: Three times a day (TID) | ORAL | 0 refills | Status: AC | PRN

## 2023-09-19 NOTE — ED Triage Notes (Signed)
 Vomiting and shakes Started this AM  Some confusion  Had similar a few months ago seen at [facility]

## 2023-09-19 NOTE — ED Notes (Signed)
Pt aware of the need for a urine... Unable to currently collect the sample.Marland KitchenMarland Kitchen

## 2023-09-19 NOTE — ED Notes (Signed)
 Discharge paperwork given and verbally understood.

## 2023-09-19 NOTE — ED Provider Notes (Signed)
 [place] EMERGENCY DEPARTMENT AT [facility] Provider Note   CSN: [id number] Arrival date & time: 09/19/23  1514     History  CC: Nausea, shaking   Morgan Mullins is a 88 y.o. female presenting to the emergency department with complaint of nausea, chills, vomiting.  Patient's husband accompanies her and she reports that she woke up this morning feeling ill, nauseous and had some vomiting at home.  She has had some uncontrollable shaking.  She denies headache, neck stiffness, sore throat, cough, diarrhea, or sick contacts in the house.  She denies dysuria, hematuria, or abdominal pain.  She does have a history of high cholesterol, high blood pressure.  Per my review of external records including her cardiology evaluation from 2022, the patient is noted have a resting heart rate in the 40s, with an echocardiogram in March 2022 showing an EF of 60 to 65%, grade 2 diastolic dysfunction.  She also wore a Zio patch monitor for 7 days noting 6 runs of SVT with the fastest lasting 4 beats, a minimum heart rate of 36 bpm, and average heart rate of 54 bpm.  HPI     Home Medications Prior to Admission medications   Medication Sig Start Date End Date Taking? Authorizing Provider  ondansetron  (ZOFRAN -ODT) 4 MG disintegrating tablet Take 1 tablet (4 mg total) by mouth every 8 (eight) hours as needed for up to 12 doses for nausea or vomiting. 09/19/23  Yes Cherith Tewell, Donnice PARAS, MD  ALPRAZolam  (XANAX ) 0.25 MG tablet Take 1 tablet (0.25 mg total) by mouth at bedtime as needed for anxiety. 03/21/17   Cleotilde Ronal RAMAN, MD  amoxicillin (AMOXIL) 500 MG capsule Take by mouth. 11/14/20   [provider]  dorzolamide -timolol  (COSOPT ) 22.3-6.8 MG/ML ophthalmic solution 1 drop 2 (two) times daily. 08/17/19   [provider]  lisinopril  (ZESTRIL ) 40 MG tablet Take 1 tablet (40 mg total) by mouth daily. 11/16/20 11/11/21  Hobart Powell BRAVO, MD  Netarsudil Dimesylate (RHOPRESSA) 0.02 % SOLN Place 1  drop into the left eye nightly. 08/17/19   [provider]  ondansetron  (ZOFRAN ) 4 MG tablet Take 1 tablet (4 mg total) by mouth every 8 (eight) hours as needed for nausea or vomiting. 07/29/23   Pamella Ozell LABOR, DO  rosuvastatin  (CRESTOR ) 10 MG tablet Take 1 tablet (10 mg total) by mouth daily. Please make overdue appt with Dr. Hobart before anymore refills. Thank you 1st attempt 12/11/21   Hobart Powell BRAVO, MD      Allergies    No known allergies    Review of Systems   Review of Systems  Physical Exam Updated Vital Signs BP (!) 146/76 (BP Location: Right Arm)   Pulse (!) 52   Temp 97.9 F (36.6 C) (Oral)   Resp 11   LMP 09/17/1987   SpO2 100%  Physical Exam Constitutional:      General: She is not in acute distress. HENT:     Head: Normocephalic and atraumatic.  Eyes:     Conjunctiva/sclera: Conjunctivae normal.     Pupils: Pupils are equal, round, and reactive to light.  Cardiovascular:     Rate and Rhythm: Regular rhythm. Bradycardia present.  Pulmonary:     Effort: Pulmonary effort is normal. No respiratory distress.  Abdominal:     General: There is no distension.     Tenderness: There is no abdominal tenderness.  Skin:    General: Skin is warm and dry.  Neurological:     General: No  focal deficit present.     Mental Status: She is alert and oriented to person, place, and time. Mental status is at baseline.  Psychiatric:        Mood and Affect: Mood normal.        Behavior: Behavior normal.     ED Results / Procedures / Treatments   Labs (all labs ordered are listed, but only abnormal results are displayed) Labs Reviewed  CBC WITH DIFFERENTIAL/PLATELET - Abnormal; Notable for the following components:      Result Value   HCT 34.8 (*)    Neutro Abs 8.2 (*)    All other components within normal limits  COMPREHENSIVE METABOLIC PANEL - Abnormal; Notable for the following components:   Glucose, Bld 116 (*)    BUN 25 (*)    GFR, Estimated 58  (*)    All other components within normal limits  URINALYSIS, ROUTINE W REFLEX MICROSCOPIC - Abnormal; Notable for the following components:   Ketones, ur TRACE (*)    All other components within normal limits  CBG MONITORING, ED - Abnormal; Notable for the following components:   Glucose-Capillary 109 (*)    All other components within normal limits  RESP PANEL BY RT-PCR (RSV, FLU A&B, COVID)  [id number]  LIPASE, BLOOD  TROPONIN I (HIGH SENSITIVITY)    EKG EKG Interpretation Date/Time:  Friday September 19 2023 15:47:10 EST Ventricular Rate:  47 PR Interval:  238 QRS Duration:  107 QT Interval:  503 QTC Calculation: 445 R Axis:   80  Text Interpretation: Sinus bradycardia Prolonged PR interval RSR' in V1 or V2, probably normal variant Left ventricular hypertrophy Compared to FEb 2018 tracing, lateral T waves more acute Confirmed by Cottie Cough [phone]) on 09/19/2023 4:01:18 PM  Radiology DG Chest Port 1 View Result Date: 09/19/2023 CLINICAL DATA:  Evaluate for pneumonia EXAM: PORTABLE CHEST 1 VIEW COMPARISON:  Chest x-ray 08/05/2013 FINDINGS: The heart size and mediastinal contours are within normal limits. Both lungs are clear. The visualized skeletal structures are unremarkable. There surgical clips in the right adnexa. IMPRESSION: No active disease. Electronically Signed   By: Greig Pique M.D.   On: 09/19/2023 19:03    Procedures Procedures    Medications Ordered in ED Medications  sodium chloride  0.9 % bolus 1,000 mL (0 mLs Intravenous Stopped 09/19/23 1804)  ondansetron  (ZOFRAN ) injection 4 mg (4 mg Intravenous Given 09/19/23 1639)    ED Course/ Medical Decision Making/ A&P Clinical Course as of 09/21/23 1058  Fri Sep 19, 2023  1907 Pt tolerated PO easily, ambulated, is asymptomatic at this time. [MT]  1922 Patient feels quite well, back to normal, her husband is comfortable taking her home.  I suspect she may have had a viral illness or foodborne illness, but do not see an  indication for hospitalization at this time. [MT]    Clinical Course User Index [MT] Taron Mondor, Cough PARAS, MD                                 Medical Decision Making Amount and/or Complexity of Data Reviewed Labs: ordered. Radiology: ordered.  Risk Prescription drug management.   This patient presents to the ED with concern for nausea, vomiting, shaking. This involves an extensive number of treatment options, and is a complaint that carries with it a high risk of complications and morbidity.  The differential diagnosis includes viral illness versus bacterial infection including UTI or pneumonia  versus atypical ACS versus other.  Given the patient's advanced age, and question of mild confusion or dementia, she is needing a broader workup for potential infection or atypical ACS.  She does not have a headache or photophobia or neck stiffness to suggest meningismus or acute meningitis.  No stroke symptoms.  He denies indication for emergent neuroimaging or lumbar puncture at this time.  Co-morbidities that complicate the patient evaluation: Age, high cholesterol, high blood pressures risk factors for cardiovascular disease  Additional history obtained from patient's husband at bedside  External records from outside source obtained and reviewed including 2022 cardiology office and outpatient workup including echocardiogram, Zio patch monitor.  From these records it appears the patient is resting heart rate is typically 40s to 50s, which it is here in the ED.  I ordered and personally interpreted labs.  The pertinent results include:  no emergent findings.  Specifically no evidence of acute anemia, dehydration, UTI, covid/flu viral illness, or LFT/lipase abnormalities  I ordered imaging studies including x-ray of the chest I independently visualized and interpreted imaging which showed no emergent findings I agree with the radiologist interpretation  The patient was maintained on a cardiac  monitor.  I personally viewed and interpreted the cardiac monitored which showed an underlying rhythm of: Sinus bradycardia  Per my interpretation the patient's ECG shows sinus bradycardia, some more acute lateral T waves than prior tracing, but does not meet STEMI criteria.  I ordered medication including IV fluid and Zofran  for nausea and hydration  I have reviewed the patients home medicines and have made adjustments as needed  Test Considered: Low suspicion for intracranial pathology, or acute pulmonary embolism, or intra-abdominal infection or inflammatory process.  No indication for CT imaging at this time  After the interventions noted above, I reevaluated the patient and found that they have: improved  - patient ambulating, tolerating PO, reporting she feels fine and back to normal.  My suspicion for sepsis, ACS, or life threatening medical condition is low at this time.  Hospitalization was considered given her age and unclear diagnosis for morning symptoms (likely viral in nature), but given patient is asymptomatic with normal vitals, normal labs, and prefers to go home, I think this is reasonable.  Her husband will be with her to watch her and bring her back if she declines.  Dispostion:  After consideration of the diagnostic results and the patients response to treatment, I feel that the patent would benefit from close outpatient follow up.         Final Clinical Impression(s) / ED Diagnoses Final diagnoses:  Nausea and vomiting, unspecified vomiting type    Rx / DC Orders ED Discharge Orders          Ordered    ondansetron  (ZOFRAN -ODT) 4 MG disintegrating tablet  Every 8 hours PRN        09/19/23 1922              Cottie Donnice PARAS, MD 09/21/23 1058
